# Patient Record
Sex: Male | Born: 1970 | Race: White | Hispanic: No | Marital: Married | Smoking: Never smoker
Health system: Southern US, Community
[De-identification: ages and names within clinical notes are randomized; demographics above are authoritative.]

## PROBLEM LIST (undated history)

## (undated) DIAGNOSIS — J309 Allergic rhinitis, unspecified: Secondary | ICD-10-CM

## (undated) DIAGNOSIS — G4733 Obstructive sleep apnea (adult) (pediatric): Secondary | ICD-10-CM

## (undated) DIAGNOSIS — I471 Supraventricular tachycardia: Secondary | ICD-10-CM

## (undated) DIAGNOSIS — I4719 Other supraventricular tachycardia: Secondary | ICD-10-CM

## (undated) DIAGNOSIS — K219 Gastro-esophageal reflux disease without esophagitis: Secondary | ICD-10-CM

## (undated) DIAGNOSIS — N4340 Spermatocele of epididymis, unspecified: Secondary | ICD-10-CM

## (undated) DIAGNOSIS — E785 Hyperlipidemia, unspecified: Secondary | ICD-10-CM

## (undated) HISTORY — DX: Gastro-esophageal reflux disease without esophagitis: K21.9

## (undated) HISTORY — DX: Other supraventricular tachycardia: I47.19

## (undated) HISTORY — DX: Allergic rhinitis, unspecified: J30.9

## (undated) HISTORY — DX: Supraventricular tachycardia: I47.1

## (undated) HISTORY — DX: Spermatocele of epididymis, unspecified: N43.40

## (undated) HISTORY — DX: Obstructive sleep apnea (adult) (pediatric): G47.33

## (undated) HISTORY — DX: Hyperlipidemia, unspecified: E78.5

---

## 2005-06-08 ENCOUNTER — Ambulatory Visit: Payer: Self-pay | Admitting: Pulmonary Disease

## 2005-07-12 ENCOUNTER — Ambulatory Visit: Payer: Self-pay | Admitting: Pulmonary Disease

## 2005-08-02 HISTORY — PX: OTHER SURGICAL HISTORY: SHX169

## 2006-07-14 ENCOUNTER — Ambulatory Visit: Payer: Self-pay | Admitting: Pulmonary Disease

## 2006-09-01 ENCOUNTER — Ambulatory Visit: Payer: Self-pay | Admitting: Pulmonary Disease

## 2007-03-09 ENCOUNTER — Ambulatory Visit: Payer: Self-pay | Admitting: Pulmonary Disease

## 2007-05-13 DIAGNOSIS — M67919 Unspecified disorder of synovium and tendon, unspecified shoulder: Secondary | ICD-10-CM | POA: Insufficient documentation

## 2007-05-13 DIAGNOSIS — M719 Bursopathy, unspecified: Secondary | ICD-10-CM

## 2007-05-13 DIAGNOSIS — J309 Allergic rhinitis, unspecified: Secondary | ICD-10-CM | POA: Insufficient documentation

## 2007-07-17 ENCOUNTER — Ambulatory Visit: Payer: Self-pay | Admitting: Pulmonary Disease

## 2007-07-23 DIAGNOSIS — N4340 Spermatocele of epididymis, unspecified: Secondary | ICD-10-CM | POA: Insufficient documentation

## 2007-07-23 DIAGNOSIS — S42009A Fracture of unspecified part of unspecified clavicle, initial encounter for closed fracture: Secondary | ICD-10-CM | POA: Insufficient documentation

## 2007-07-23 DIAGNOSIS — J329 Chronic sinusitis, unspecified: Secondary | ICD-10-CM | POA: Insufficient documentation

## 2007-07-23 DIAGNOSIS — K219 Gastro-esophageal reflux disease without esophagitis: Secondary | ICD-10-CM

## 2007-07-23 LAB — CONVERTED CEMR LAB
ALT: 29 units/L (ref 0–53)
AST: 24 units/L (ref 0–37)
Albumin: 4.6 g/dL (ref 3.5–5.2)
Alkaline Phosphatase: 42 units/L (ref 39–117)
Basophils Absolute: 0 10*3/uL (ref 0.0–0.1)
Basophils Relative: 0.3 % (ref 0.0–1.0)
Bilirubin Urine: NEGATIVE
CO2: 33 meq/L — ABNORMAL HIGH (ref 19–32)
Calcium: 9.7 mg/dL (ref 8.4–10.5)
Chloride: 102 meq/L (ref 96–112)
Cholesterol: 197 mg/dL (ref 0–200)
Creatinine, Ser: 0.9 mg/dL (ref 0.4–1.5)
Eosinophils Absolute: 0 10*3/uL (ref 0.0–0.6)
Eosinophils Relative: 0.8 % (ref 0.0–5.0)
GFR calc Af Amer: 121 mL/min
GFR calc non Af Amer: 100 mL/min
Hemoglobin: 16.2 g/dL (ref 13.0–17.0)
MCV: 89.8 fL (ref 78.0–100.0)
Monocytes Absolute: 0.4 10*3/uL (ref 0.2–0.7)
Neutrophils Relative %: 71.4 % (ref 43.0–77.0)
Platelets: 170 10*3/uL (ref 150–400)
Potassium: 4.1 meq/L (ref 3.5–5.1)
RDW: 11.5 % (ref 11.5–14.6)
Sodium: 142 meq/L (ref 135–145)
Total Protein, Urine: NEGATIVE mg/dL
Urobilinogen, UA: 0.2 (ref 0.0–1.0)
VLDL: 25 mg/dL (ref 0–40)

## 2007-09-21 ENCOUNTER — Ambulatory Visit: Payer: Self-pay | Admitting: Pulmonary Disease

## 2007-09-21 ENCOUNTER — Encounter: Payer: Self-pay | Admitting: Adult Health

## 2007-10-18 ENCOUNTER — Ambulatory Visit: Payer: Self-pay | Admitting: Pulmonary Disease

## 2008-02-13 ENCOUNTER — Ambulatory Visit: Payer: Self-pay | Admitting: Pulmonary Disease

## 2008-03-11 ENCOUNTER — Encounter: Payer: Self-pay | Admitting: Pulmonary Disease

## 2008-03-14 ENCOUNTER — Ambulatory Visit (HOSPITAL_BASED_OUTPATIENT_CLINIC_OR_DEPARTMENT_OTHER): Admission: RE | Admit: 2008-03-14 | Discharge: 2008-03-14 | Payer: Self-pay | Admitting: Pulmonary Disease

## 2008-03-14 ENCOUNTER — Encounter: Payer: Self-pay | Admitting: Pulmonary Disease

## 2008-03-19 ENCOUNTER — Encounter: Payer: Self-pay | Admitting: Pulmonary Disease

## 2008-03-22 ENCOUNTER — Ambulatory Visit: Payer: Self-pay | Admitting: Pulmonary Disease

## 2008-04-16 ENCOUNTER — Encounter: Payer: Self-pay | Admitting: Pulmonary Disease

## 2008-04-22 ENCOUNTER — Ambulatory Visit: Payer: Self-pay | Admitting: Pulmonary Disease

## 2008-04-22 DIAGNOSIS — G4733 Obstructive sleep apnea (adult) (pediatric): Secondary | ICD-10-CM | POA: Insufficient documentation

## 2008-06-25 ENCOUNTER — Encounter: Payer: Self-pay | Admitting: Pulmonary Disease

## 2008-07-02 ENCOUNTER — Encounter: Payer: Self-pay | Admitting: Pulmonary Disease

## 2008-07-18 ENCOUNTER — Ambulatory Visit: Payer: Self-pay | Admitting: Pulmonary Disease

## 2008-10-21 ENCOUNTER — Ambulatory Visit: Payer: Self-pay | Admitting: Pulmonary Disease

## 2008-11-07 ENCOUNTER — Telehealth (INDEPENDENT_AMBULATORY_CARE_PROVIDER_SITE_OTHER): Payer: Self-pay | Admitting: *Deleted

## 2008-12-24 ENCOUNTER — Encounter: Payer: Self-pay | Admitting: Pulmonary Disease

## 2009-01-14 ENCOUNTER — Encounter: Payer: Self-pay | Admitting: Pulmonary Disease

## 2009-07-15 ENCOUNTER — Encounter: Payer: Self-pay | Admitting: Pulmonary Disease

## 2010-01-05 ENCOUNTER — Ambulatory Visit: Payer: Self-pay | Admitting: Pulmonary Disease

## 2010-01-06 DIAGNOSIS — J069 Acute upper respiratory infection, unspecified: Secondary | ICD-10-CM | POA: Insufficient documentation

## 2010-01-07 ENCOUNTER — Telehealth: Payer: Self-pay | Admitting: Adult Health

## 2010-01-13 ENCOUNTER — Encounter: Payer: Self-pay | Admitting: Pulmonary Disease

## 2010-02-24 ENCOUNTER — Encounter: Payer: Self-pay | Admitting: Pulmonary Disease

## 2010-04-01 ENCOUNTER — Ambulatory Visit: Payer: Self-pay | Admitting: Pulmonary Disease

## 2010-04-05 DIAGNOSIS — E785 Hyperlipidemia, unspecified: Secondary | ICD-10-CM | POA: Insufficient documentation

## 2010-04-05 LAB — CONVERTED CEMR LAB
ALT: 19 units/L (ref 0–53)
AST: 19 units/L (ref 0–37)
Alkaline Phosphatase: 52 units/L (ref 39–117)
BUN: 13 mg/dL (ref 6–23)
Basophils Absolute: 0 10*3/uL (ref 0.0–0.1)
Bilirubin, Direct: 0.2 mg/dL (ref 0.0–0.3)
Calcium: 9.7 mg/dL (ref 8.4–10.5)
Creatinine, Ser: 0.8 mg/dL (ref 0.4–1.5)
GFR calc non Af Amer: 116.64 mL/min (ref >60)
Glucose, Bld: 87 mg/dL (ref 70–99)
LDL Cholesterol: 129 mg/dL — ABNORMAL HIGH (ref 0–99)
Lymphocytes Relative: 23.9 % (ref 12.0–46.0)
MCHC: 35.1 g/dL (ref 30.0–36.0)
Monocytes Absolute: 0.5 10*3/uL (ref 0.1–1.0)
Monocytes Relative: 7.9 % (ref 3.0–12.0)
Neutro Abs: 4.2 10*3/uL (ref 1.4–7.7)
Neutrophils Relative %: 66.1 % (ref 43.0–77.0)
Platelets: 154 10*3/uL (ref 150.0–400.0)
RBC: 5.35 M/uL (ref 4.22–5.81)
RDW: 12.9 % (ref 11.5–14.6)
Specific Gravity, Urine: 1.02 (ref 1.000–1.030)
Total CHOL/HDL Ratio: 5
Total Protein, Urine: NEGATIVE mg/dL
Triglycerides: 155 mg/dL — ABNORMAL HIGH (ref 0.0–149.0)
Urine Glucose: NEGATIVE mg/dL
WBC: 6.4 10*3/uL (ref 4.5–10.5)

## 2010-05-06 ENCOUNTER — Telehealth (INDEPENDENT_AMBULATORY_CARE_PROVIDER_SITE_OTHER): Payer: Self-pay | Admitting: *Deleted

## 2010-06-21 ENCOUNTER — Encounter: Payer: Self-pay | Admitting: Pulmonary Disease

## 2010-07-14 ENCOUNTER — Encounter: Payer: Self-pay | Admitting: Pulmonary Disease

## 2010-08-05 ENCOUNTER — Encounter: Payer: Self-pay | Admitting: Pulmonary Disease

## 2010-08-30 LAB — CONVERTED CEMR LAB
AST: 24 units/L (ref 0–37)
Albumin: 4.5 g/dL (ref 3.5–5.2)
Alkaline Phosphatase: 58 units/L (ref 39–117)
Bacteria, UA: NEGATIVE
Basophils Relative: 0.3 % (ref 0.0–3.0)
Calcium: 9.6 mg/dL (ref 8.4–10.5)
Eosinophils Absolute: 0.1 10*3/uL (ref 0.0–0.7)
GFR calc non Af Amer: 100 mL/min
HDL: 38.2 mg/dL — ABNORMAL LOW (ref >39.0)
Hemoglobin: 17.1 g/dL — ABNORMAL HIGH (ref 13.0–17.0)
Lymphocytes Relative: 25.4 % (ref 12.0–46.0)
MCHC: 34.5 g/dL (ref 30.0–36.0)
Monocytes Absolute: 0.5 10*3/uL (ref 0.1–1.0)
Monocytes Relative: 8.2 % (ref 3.0–12.0)
Neutro Abs: 3.7 10*3/uL (ref 1.4–7.7)
Nitrite: NEGATIVE
Potassium: 4 meq/L (ref 3.5–5.1)
RBC / HPF: NONE SEEN
RDW: 11.5 % (ref 11.5–14.6)
Specific Gravity, Urine: 1.015 (ref 1.000–1.03)
Total Protein, Urine: NEGATIVE mg/dL
Urine Glucose: NEGATIVE mg/dL
Urobilinogen, UA: 0.2 (ref 0.0–1.0)
pH: 7 (ref 5.0–8.0)

## 2010-09-02 NOTE — Progress Notes (Signed)
  Phone Note Other Incoming   Request: Send information Summary of Call: Request for records received from [facility]. Request forwarded to [facility]. Kriste Basque )

## 2010-09-02 NOTE — Letter (Signed)
[facility]   Imported By: Lennie Odor 07/06/2010 16:10:31  _____________________________________________________________________  External Attachment:    Type:   Image     Comment:   External Document

## 2010-09-02 NOTE — Assessment & Plan Note (Signed)
Summary: Acute NP office visit    Copy to:  Alroy Dust Primary Provider/Referring Provider:  Kriste Basque  CC:  sinus pressure/congestion, cough occ producing dark brown/yellow mucus, PND, body aches, weakness/fatigue, and occ nausea x2weeks. Marland Kitchen  History of Present Illness: 40  y/o WM here for a follow up visit & CPX... he & wife are expecting their first child (prob male by sonar) in April...   ~  seen 7/09 w/ bout of sinusitis w/ some persistant upper airway symptoms... he's had sevearal weeks of feeling poorly, sinus congestion/ draining/ stuffy w/ pressure in the head... assoc dizzy, fatigued, and coughing... he went to a Med 1st and diagnosed w/ sinusitis after an XRay was done- given Augmentin 875mg  Bid for 10d + Flonase... slight better but symptoms persist... we discuused Rx w/ Medrol to reduce the upper airway inflamm...   ~  July 18, 2008:  he reports doing well overall without new complaints or cioncerns...  January 05, 2010--Presents for an acute office visit. Complains of sinus pressure/congestion, cough occ producing dark brown/yellow mucus, PND, body aches, weakness/fatigue, occ nausea x2weeks.  OTC not working. He is living in [place]. Has CPX set up for later ths summer. Wants to keep PCP as Kriste Basque. Denies chest pain, dyspnea, orthopnea, hemoptysis, fever, n/v/d, edema, headache./   Medications Prior to Update: 1)  Ocean Nasal Spray 0.65 %  Soln (Saline) .... As Needed 2)  Metoprolol Succinate 25 Mg Xr24h-Tab (Metoprolol Succinate) .... Take 1 Tablet By Mouth Once A Day 3)  Fish Oil 1000 Mg Caps (Omega-3 Fatty Acids) .... Take 1 Tablet By Mouth Once A Day 4)  Prilosec Otc 20 Mg  Tbec (Omeprazole Magnesium) .... Take 1 Tablet By Mouth Every Morning 30 Min Before Meal 5)  Pepcid 20 Mg  Tabs (Famotidine) .... Take 1 Tab By Mouth At Bedtime As Needed 6)  Multivitamins  Tabs (Multiple Vitamin) .... Take 1 Tablet By Mouth Once A Day 7)  Vitamin B-12 1000 Mcg Tabs (Cyanocobalamin) ....  Take 1 Tablet By Mouth Once A Day  Current Medications (verified): 1)  Ocean Nasal Spray 0.65 %  Soln (Saline) .... As Needed 2)  Metoprolol Succinate 25 Mg Xr24h-Tab (Metoprolol Succinate) .... Take 1 Tablet By Mouth Once A Day 3)  Fish Oil 1000 Mg Caps (Omega-3 Fatty Acids) .... Take 1 Tablet By Mouth Once A Day 4)  Prilosec Otc 20 Mg  Tbec (Omeprazole Magnesium) .... Take 1 Tablet By Mouth Every Morning 30 Min Before Meal 5)  Pepcid 20 Mg  Tabs (Famotidine) .... Take 1 Tab By Mouth At Bedtime As Needed 6)  Multivitamins  Tabs (Multiple Vitamin) .... Take 1 Tablet By Mouth Once A Day 7)  Vitamin B-12 1000 Mcg Tabs (Cyanocobalamin) .... Take 1 Tablet By Mouth Once A Day  Allergies (verified): 1)  ! Codeine 2)  ! Cephalexin 3)  ! * Shellfish  Past History:  Past Surgical History: Last updated: 07/18/2008 S/P left clavicle surgery after fracture in 2007  Family History: Last updated: 04/22/2008 heart disease: father, maternal aunt, paternal grandmother cancer: maternal grandmother (skin)  Social History: Last updated: 04/22/2008 Works for [employer]  pt is married. Patient never smoked.   Risk Factors: Smoking Status: never (07/18/2008)  Past Medical History:  ALLERGIC RHINITIS (ICD-477.9) - uses Ocean spray and Flonase...  Hx of SINUSITIS (ICD-473.9) - resolved w/ Rx in Jul09...  OBSTRUCTIVE SLEEP APNEA (ICD-327.23)  ~  wife c/o his snoring... he has done this  for years w/ college buddies also noting his snoring w/ apnea events... wife is a sound sleeper & hasn't noted apneas, restlessness, etc... he does note tiredness in the AM's however & we did a sleep study 8/09 w/ AHI= 11 & RDI= 21 w/ desat to 86%... given trial CPAP 8 & saw DrClance 9/09 w/ referral to [facility] dental clinic for oral appliance- he will get this soon and give it a try...  Hx of PREMATURE VENTRICULAR CONTRACTIONS (ICD-427.69) - he had thorough eval by DrKennedy in [facility] w/ event monitor showing atrial tachy and Stress Echo that was normal 8/09... Rx w/ METOPROLOL ER 25mg /d...     Review of Systems      See HPI  Vital Signs:  Patient profile:   40 year old male Height:      75 inches Weight:      183 pounds BMI:     22.96 O2 Sat:      98 % on Room air Temp:     98.1 degrees F oral Pulse rate:   79 / minute BP sitting:   132 / 94  (left arm) Cuff size:   regular  Vitals Entered By: Boone Master CNA/MA (January 05, 2010 3:03 PM)  O2 Flow:  Room air CC: sinus pressure/congestion, cough occ producing dark brown/yellow mucus, PND, body aches, weakness/fatigue, occ nausea x2weeks.  Is Patient Diabetic? No Comments Medications reviewed with patient Daytime contact number verified with patient. Boone Master CNA/MA  January 05, 2010 3:05 PM    Physical Exam  Additional Exam:  GEN: A/Ox3; pleasant , NAD HEENT:  [place]/AT, , EACs-clear, TMs-wnl, NOSE-clear, THROAT-clear NECK:  Supple w/ fair ROM; no JVD; normal carotid impulses w/o bruits; no thyromegaly or nodules palpated; no lymphadenopathy. RESP  Clear to P & A; w/o, wheezes/ rales/ or rhonchi. CARD:  RRR, no m/r/g   GI:   Soft & nt; nml bowel sounds; no organomegaly or masses detected. [place]: Warm bil,  no calf tenderness edema, clubbing, pulses intact     Impression & Recommendations:  Problem # 1:  URI (ICD-465.9)  Zithromax 500mg  once daily for 3 days Mucinex DM two times a day as needed cough/congesiton  Zyrtec 10mg  at bedtime for 5 days for drainage.  Increase fluids. , rest. May use tylenol as needed fever  Saline nasal rinse as needed  Please contact office for sooner follow up if symptoms do not improve or worsen   Orders: Est. Patient Level III (14782)  Medications Added to Medication List This Visit: 1)  Zithromax Tri-pak 500 Mg Tabs (Azithromycin) .... Take as directed  Complete Medication List: 1)  Ocean Nasal Spray 0.65 % Soln (Saline) .... As  needed 2)  Metoprolol Succinate 25 Mg Xr24h-tab (Metoprolol succinate) .... Take 1 tablet by mouth once a day 3)  Fish Oil 1000 Mg Caps (Omega-3 fatty acids) .... Take 1 tablet by mouth once a day 4)  Prilosec Otc 20 Mg Tbec (Omeprazole magnesium) .... Take 1 tablet by mouth every morning 30 min before meal 5)  Pepcid 20 Mg Tabs (Famotidine) .... Take 1 tab by mouth at bedtime as needed 6)  Multivitamins Tabs (Multiple vitamin) .... Take 1 tablet by mouth once a day 7)  Vitamin B-12 1000 Mcg Tabs (Cyanocobalamin) .... Take 1 tablet by mouth once a day 8)  Zithromax Tri-pak 500 Mg Tabs (Azithromycin) .... Take as directed  Patient Instructions: 1)  follow up with Dr. Kriste Basque for  physical in 3 months for fasting labs. as scheduled  2)  Zithromax 500mg  once daily for 3 days 3)  Mucinex DM two times a day as needed cough/congesiton  4)  Zyrtec 10mg  at bedtime for 5 days for drainage.  5)  Increase fluids. , rest. May use tylenol as needed fever  6)  Saline nasal rinse as needed  7)  Please contact office for sooner follow up if symptoms do not improve or worsen  Prescriptions: ZITHROMAX TRI-PAK 500 MG TABS (AZITHROMYCIN) take as directed  #1 x 0   Entered and Authorized by:   Rubye Oaks NP   Signed by:   Rubye Oaks NP on 01/05/2010   Method used:   Electronically to        [facility] [address] (retail)       [address]       [place], [place]  [address]       Ph: [phone]       Fax: [phone]   RxID:   [id number]   Prevention & Chronic Care Immunizations   Influenza vaccine: Historical  (05/02/2009)    Tetanus booster: Not documented    Pneumococcal vaccine: Not documented  Other Screening   Smoking status: never  (07/18/2008)  Lipids   Total Cholesterol: 184  (07/18/2008)   LDL: 132  (07/18/2008)   LDL Direct: Not documented   HDL: 38.2  (07/18/2008)   Triglycerides: 69  (07/18/2008)    Immunization History:  Influenza Immunization  History:    Influenza:  historical (05/02/2009)

## 2010-09-02 NOTE — Progress Notes (Signed)
Summary: fever  Phone Note Call from Patient   Caller: Patient Call For: parrett Summary of Call: pt have fever was seen by  tammy this week for sinus problem .still have fever taking advil only able to get it down to 100 [facility] [phone] Initial call taken by: Rickard Patience,  January 07, 2010 8:25 AM  Follow-up for Phone Call        Pt states at time of OV he had not been having any fever, but yesterday he began to have a fever of 101.4. he is taking advil every 4 hours and staes he can't get fever below 100. He has one day left on abx course. Pt just wanted to let TP know since this was a change, and if she had any further recs. Please advise. Carron Curie CMA  January 07, 2010 8:55 AM   Additional Follow-up for Phone Call Additional follow up Details #1::        zpack last in system in for 10 days would alternate tylenol and motrin increase fluids.  Please contact office for sooner follow up if symptoms do not improve or worsen  Additional Follow-up by: Rubye Oaks NP,  January 07, 2010 9:47 AM    Additional Follow-up for Phone Call Additional follow up Details #2::    Pt advised. Carron Curie CMA  January 07, 2010 9:57 AM

## 2010-09-02 NOTE — Letter (Signed)
[facility]   Imported By: Lester Crookston 03/04/2010 07:49:33  _____________________________________________________________________  External Attachment:    Type:   Image     Comment:   External Document

## 2010-09-02 NOTE — Letter (Signed)
Summary: [facility]  [facility]   Imported By: Lester Rothschild 08/05/2009 10:53:23  _____________________________________________________________________  External Attachment:    Type:   Image     Comment:   External Document

## 2010-09-02 NOTE — Letter (Signed)
Summary: [facility]   Imported By: Lennie Odor 03/03/2010 14:28:52  _____________________________________________________________________  External Attachment:    Type:   Image     Comment:   External Document

## 2010-09-02 NOTE — Assessment & Plan Note (Signed)
Summary: physical ///kp   Primary Care Knowledge [place]:  Kriste Basque  CC:  20 month ROV & CPX....  History of Present Illness: 40 y/o WM here for a follow up visit & CPX...    ~  seen 7/09 w/ bout of sinusitis w/ some persistant upper airway symptoms... he's had sevearal weeks of feeling poorly, sinus congestion/ draining/ stuffy w/ pressure in the head... assoc dizzy, fatigued, and coughing... he went to a Med 1st and diagnosed w/ sinusitis after an XRay was done- given Augmentin 875mg  Bid for 10d + Flonase... slight better but symptoms persist... we discuused Rx w/ Medrol to reduce the upper airway inflamm...   ~  April 01, 2010:  Baby daughter [name] born 02/28/09... he had f/u Cards in [place]- atyp CP, hx atrial tachy, occas palpit, doing satis on [id number]...     Current Problem List:  ALLERGIC RHINITIS (ICD-477.9) - uses Ocean spray and Flonase...  Hx of SINUSITIS (ICD-473.9) - resolved w/ Rx in Jul09...  OBSTRUCTIVE SLEEP APNEA (ICD-327.23) - initially wife c/o his snoring... he has done this for years w/ college buddies also noting his snoring w/ apnea events... wife is a sound sleeper & hasn't noted apneas, restlessness, etc... he did note tiredness in the AM's & we did a sleep study 8/09 w/ AHI= 11 & RDI= 21 w/ desat to 86%... given trial CPAP 8 & saw DrClance 9/09 w/ referral to [facility] dental clinic for oral appliance- he reports this caused some jaw problems therefore stopped using it & returned to CPAP but stopped this as well over time... now he states he is resting well- not snoring, no daytime sleep pressure & doing OK.  Hx of PREMATURE VENTRICULAR CONTRACTIONS (ICD-427.69) - he had thorough eval by DrKennedy in [facility] w/ event monitor showing atrial tachy and Stress Echo that was normal 8/09... Rx w/ METOPROLOL ER 25mg /d... denies HA, fatigue, visual changes, CP, palipit, dizziness, syncope, dyspnea, edema, etc...   HYPERLIPIDEMIA, BORDERLINE  (ICD-272.4) - on diet Rx alone...  ~  FLP 12/08 showed TChol 197, TG 124, HDL 39, LDL 133  ~  FLP 12/09 showed TChol 184, TG 69, HDL 38, LDL 132  ~  FLP 8/11 showed TChol 199, TG 155, HDL 39, LDL 129  GERD (ICD-530.81) - on PRILOSEC 20mg /d and PEPCID at bedtime...  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 f/u Prn.  Hx of FRACTURE, CLAVICLE, RIGHT (ICD-810.00) w/ surgery... BURSITIS, RIGHT SHOULDER (ICD-726.10) - he is followed by DrCalloway in [facility] for Ortho & had physical therapy...  Health Maintenance:  he takes FISH OIL & MVI... he gets the Flu vaccine yearly & last Tetanus shot was ??   Preventive Screening-Counseling & Management  Alcohol-Tobacco     Smoking Status: never  Allergies: 1)  ! Codeine 2)  ! Cephalexin 3)  ! * Shellfish  Comments:  Nurse/Medical Assistant: The patient's medications and allergies were reviewed with the patient and were updated in the Medication and Allergy Lists.  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 heart disease: father, maternal aunt, paternal grandmother cancer: maternal grandmother (  skin)  Social History: Reviewed history from 04/22/2008 and no changes required. Works for [employer]  pt is married. Patient never smoked.   Review of Systems       The patient complains of nasal congestion and palpitations.  The patient denies fever, chills, sweats,  anorexia, fatigue, weakness, malaise, weight loss, sleep disorder, blurring, diplopia, eye irritation, eye discharge, vision loss, eye pain, photophobia, earache, ear discharge, tinnitus, decreased hearing, nosebleeds, sore throat, hoarseness, chest pain, syncope, dyspnea on exertion, orthopnea, PND, peripheral edema, cough, dyspnea at rest, excessive sputum, hemoptysis, wheezing, pleurisy, nausea, vomiting, diarrhea, constipation, change in bowel habits, abdominal pain, melena, hematochezia, jaundice, gas/bloating, indigestion/heartburn, dysphagia, odynophagia, dysuria, hematuria, urinary frequency, urinary hesitancy, nocturia, incontinence, back pain, joint pain, joint swelling, muscle cramps, muscle weakness, stiffness, arthritis, sciatica, restless legs, leg pain at night, leg pain with exertion, rash, itching, dryness, suspicious lesions, paralysis, paresthesias, seizures, tremors, vertigo, transient blindness, frequent falls, frequent headaches, difficulty walking, depression, anxiety, memory loss, confusion, cold intolerance, heat intolerance, polydipsia, polyphagia, polyuria, unusual weight change, abnormal bruising, bleeding, enlarged lymph nodes, urticaria, allergic rash, hay fever, and recurrent infections.    Vital Signs:  Patient profile:   40 year old male Height:      75 inches Weight:      182 pounds BMI:     22.83 O2 Sat:      97 % on Room air Temp:     97.1 degrees F oral Pulse rate:   67 / minute BP sitting:   120 / 82  (left arm) Cuff size:   regular  Vitals Entered By: Randell Loop CMA (April 01, 2010 10:03 AM)  O2 Sat at Rest %:  97 O2 Flow:  Room air CC: 20 month ROV & CPX... Is Patient Diabetic? No Pain Assessment Patient in pain? no      Comments meds updated today   Physical Exam  Additional Exam:  WD, WN, 40 y/o WM in NAD... GENERAL:  Alert & oriented; pleasant & cooperative... HEENT:  [place]/AT, EOM-wnl, PERRLA, Fundi-benign, EACs-clear, TMs-wnl, NOSE-clear,  THROAT-clear & wnl. NECK:  Supple w/ full ROM; no JVD; normal carotid impulses w/o bruits; no thyromegaly or nodules palpated; no lymphadenopathy. Scar over right clacicle from prev fx & surg... CHEST:  Clear to P & A; without wheezes/ rales/ or rhonchi. HEART:  Regular Rhythm; without murmurs/ rubs/ or gallops. ABDOMEN:  Soft & nontender; normal bowel sounds; no organomegaly or masses detected. RECTAL:  Neg - prostate 2+ & nontender w/o nodules; stool hematest neg; +left spermatocele... EXT: without deformities or arthritic changes; no varicose veins/ venous insuffic/ or edema. NEURO:  CN's intact; motor testing normal; sensory testing normal; gait normal & balance OK. DERM:  No lesions noted; no rash etc...    CXR  Procedure date:  04/01/2010  Findings:      CHEST - 2 VIEW Comparison: Chest x-ray 07/18/2008   Findings: The cardiac silhouette, mediastinal and hilar contours are within normal limits.  The lungs are clear.  The bony thorax is intact.  Stable right clavicular hardware.   IMPRESSION: No acute cardiopulmonary findings.  No change since prior study.   Read By:  Cyndie Chime,  M.D.   MISC. Report  Procedure date:  04/01/2010  Findings:      BMP (METABOL)   Sodium                    141 mEq/L  135-145   Potassium                 4.0 mEq/L                   3.5-5.1   Chloride                  102 mEq/L                   96-112   Carbon Dioxide            30 mEq/L                    19-32   Glucose                   87 mg/dL                    56-38   BUN                       13 mg/dL                    7-56   Creatinine                0.8 mg/dL                   4.3-3.2   Calcium                   9.7 mg/dL                   9.5-18.8   GFR                       116.64 mL/min               >60  Hepatic/Liver Function Panel (HEPATIC)   Total Bilirubin      [H]  1.7 mg/dL                   4.1-6.6   Direct Bilirubin          0.2 mg/dL                    0.6-3.0   Alkaline Phosphatase      52 U/L                      39-117   AST                       19 U/L                      0-37   ALT                       19 U/L                      0-53   Total Protein             7.4 g/dL                    1.6-0.1   Albumin                   4.8 g/dL  3.5-5.2  CBC Platelet w/Diff (CBCD)   White Cell Count          6.4 K/uL                    4.5-10.5   Red Cell Count            5.35 Mil/uL                 4.22-5.81   Hemoglobin                16.7 g/dL                   16.1-09.6   Hematocrit                47.7 %                      39.0-52.0   MCV                       89.0 fl                     78.0-100.0   Platelet Count            154.0 K/uL                  150.0-400.0   Neutrophil %              66.1 %                      43.0-77.0   Lymphocyte %              23.9 %                      12.0-46.0   Monocyte %                7.9 %                       3.0-12.0   Eosinophils%              1.7 %                       0.0-5.0   Basophils %               0.4 %                       0.0-3.0  Comments:      Lipid Panel (LIPID)   Cholesterol               199 mg/dL                   0-454   Triglycerides        [H]  155.0 mg/dL                 0.9-811.9   HDL                       14.78 mg/dL                 >29.56   LDL Cholesterol      [H]  213 mg/dL  0-99   TSH (TSH)   FastTSH                   1.35 uIU/mL                 0.35-5.50  UDip w/Micro (URINE)   Color                     LT. YELLOW   Clarity                   CLEAR                       Clear   Specific Gravity          1.020                       1.000 - 1.030   Urine Ph                  7.0                         5.0-8.0   Protein                   NEGATIVE                    Negative   Urine Glucose             NEGATIVE                    Negative   Ketones                   NEGATIVE                    Negative   Urine  Bilirubin           NEGATIVE                    Negative   Blood                     NEGATIVE                    Negative   Urobilinogen              0.2                         0.0 - 1.0   Leukocyte Esterace        NEGATIVE                    Negative   Nitrite                   NEGATIVE                    Negative   Urine Mucus               Presence of                 None   Impression & Recommendations:  Problem # 1:  PHYSICAL EXAMINATION (ICD-V70.0)  Orders: T-2 View CXR (71020TC) TLB-BMP (Basic Metabolic Panel-BMET) (80048-METABOL) TLB-Hepatic/Liver Function Pnl (80076-HEPATIC) TLB-CBC Platelet - w/Differential (85025-CBCD) TLB-Lipid Panel (80061-LIPID) TLB-TSH (Thyroid Stimulating  Hormone) (84443-TSH) TLB-Udip w/ Micro (81001-URINE)  Problem # 2:  OBSTRUCTIVE SLEEP APNEA (ICD-327.23) He states doing well off all therapy- no hypersomnolence etc...  Problem # 3:  Hx of PREMATURE VENTRICULAR CONTRACTIONS (ICD-427.69) He has been eval at Cardiology in > [place]x atriasl tachy & atyp CP on TOPROL XL 25/d & doing satis... His updated medication list for this problem includes:    Metoprolol Succinate 25 Mg Xr24h-tab (Metoprolol succinate) .Marland Kitchen... Take 1 tablet by mouth once a day  Problem # 4:  HYPERLIPIDEMIA, BORDERLINE (ICD-272.4) On diet alone...  Problem # 5:  GERD (ICD-530.81) Stable w/ OTC med Rx as needed... His updated medication list for this problem includes:    Prilosec Otc 20 Mg Tbec (Omeprazole magnesium) .Marland Kitchen... Take 1 tablet by mouth every morning 30 min before meal    Pepcid 20 Mg Tabs (Famotidine) .Marland Kitchen... 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  Problem # 7:  OTHER MEDICAL ISSUEWS AS NOTED>>>  Complete Medication List: 1)  Ocean Nasal Spray 0.65 % Soln (Saline) .... As needed 2)  Metoprolol Succinate 25 Mg Xr24h-tab (Metoprolol succinate) .... Take 1 tablet by mouth once a  day 3)  Fish Oil 1000 Mg Caps (Omega-3 fatty acids) .... Take 1 tablet by mouth once a day 4)  Prilosec Otc 20 Mg Tbec (Omeprazole magnesium) .... Take 1 tablet by mouth every morning 30 min before meal 5)  Pepcid 20 Mg Tabs (Famotidine) .... Take 1 tab by mouth at bedtime as needed 6)  Multivitamins Tabs (Multiple vitamin) .... Take 1 tablet by mouth once a day 7)  Vitamin B-12 1000 Mcg Tabs (Cyanocobalamin) .... Take 1 tablet by mouth once a day  Patient Instructions: 1)  Today we updated your med list- see below.... 2)  Continue your current meds the same... 3)  Today we did your follow up CXR & Fasting blood work... please call the "phone tree" in a few days for your lab results.Marland KitchenMarland Kitchen 4)  Call for any problems.Marland KitchenMarland Kitchen 5)  Please schedule a follow-up appointment in 1 year.

## 2010-09-03 NOTE — Letter (Signed)
Summary: [facility]  [facility]   Imported By: Sherian Rein 08/14/2010 14:16:43  _____________________________________________________________________  External Attachment:    Type:   Image     Comment:   External Document

## 2010-09-03 NOTE — Letter (Signed)
[facility]   Imported By: Lester Whitfield 08/14/2010 08:56:13  _____________________________________________________________________  External Attachment:    Type:   Image     Comment:   External Document

## 2010-09-30 ENCOUNTER — Ambulatory Visit (INDEPENDENT_AMBULATORY_CARE_PROVIDER_SITE_OTHER): Payer: BC Managed Care – PPO | Admitting: Adult Health

## 2010-09-30 ENCOUNTER — Encounter: Payer: Self-pay | Admitting: Adult Health

## 2010-09-30 DIAGNOSIS — R079 Chest pain, unspecified: Secondary | ICD-10-CM

## 2010-09-30 DIAGNOSIS — I10 Essential (primary) hypertension: Secondary | ICD-10-CM

## 2010-10-01 DIAGNOSIS — R079 Chest pain, unspecified: Secondary | ICD-10-CM | POA: Insufficient documentation

## 2010-10-08 NOTE — Assessment & Plan Note (Signed)
Summary: Acute NP office visit - pulled muscle   Copy to:  Trevor Mccullough Primary Provider/Referring Provider:  Kriste Mccullough  CC:  pulled muscle in chest on right pectoral x29month - states has been "taking it easy" but denies any other therapies.  also c/o some occasional HTN w/ HA and brought BP log with him today.  History of Present Illness: 40  with known hx of PVC's, GERD, and borderline hyperlipidemia   September 30, 2010 --Presents for an acute office visit. Says he pulled muscle in chest on right pectoral x93month ago. Has small child and feels it pull when he picks them up. This past week bent down to pick up child and felt pain and soreness in right upper chest wall. Tender to touch,more pain with lifting, and turning.No exertional chest pain, palpitations, syncope or dizziness. Has not taken any meds for tx.  Also noticed b/p has been elevated over last several weeks. [facility]. His b/p log at home avg b/p 130-140 / 90. Denies  orthopnea, hemoptysis, fever, n/v/d, edema, headache, heartburn, belching , cough  or dypsnea, .   Medications Prior to Update: 1)  Ocean Nasal Spray 0.65 %  Soln (Saline) .... As Needed 2)  Metoprolol Succinate 25 Mg Xr24h-Tab (Metoprolol Succinate) .... Take 1 Tablet By Mouth Once A Day 3)  Fish Oil 1000 Mg Caps (Omega-3 Fatty Acids) .... Take 1 Tablet By Mouth Once A Day 4)  Prilosec Otc 20 Mg  Tbec (Omeprazole Magnesium) .... Take 1 Tablet By Mouth Every Morning 30 Min Before Meal 5)  Pepcid 20 Mg  Tabs (Famotidine) .... Take 1 Tab By Mouth At Bedtime As Needed 6)  Multivitamins  Tabs (Multiple Vitamin) .... Take 1 Tablet By Mouth Once A Day 7)  Vitamin B-12 1000 Mcg Tabs (Cyanocobalamin) .... Take 1 Tablet By Mouth Once A Day  Current Medications (verified): 1)  Ocean Nasal Spray 0.65 %  Soln (Saline) .... As Needed 2)  Metoprolol Succinate 25 Mg Xr24h-Tab (Metoprolol Succinate) .... Take 1 Tablet By Mouth Once A Day 3)  Fish Oil  1000 Mg Caps (Omega-3 Fatty Acids) .... Take 1 Tablet By Mouth Once A Day 4)  Prilosec Otc 20 Mg  Tbec (Omeprazole Magnesium) .... Take 1 Tablet By Mouth Every Morning 30 Min Before Meal 5)  Pepcid 20 Mg  Tabs (Famotidine) .... Take 1 Tab By Mouth At Bedtime As Needed 6)  Multivitamins  Tabs (Multiple Vitamin) .... Take 1 Tablet By Mouth Once A Day 7)  Vitamin B-12 1000 Mcg Tabs (Cyanocobalamin) .... Take 1 Tablet By Mouth Once A Day  Allergies (verified): 1)  ! Codeine 2)  ! Cephalexin 3)  ! 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 Patient never smoked.   Risk Factors: Smoking Status: never (04/01/2010)  Review of Systems      See HPI  Vital Signs:  Patient profile:   40 year old male Height:  75 inches Weight:      182.38 pounds BMI:     22.88 O2 Sat:      97 % on Room air Temp:     97.0 degrees F oral Pulse rate:   74 / minute BP sitting:   136 / 100  (left arm) Cuff size:   regular  Vitals Entered By: Boone Master CNA/MA (September 30, 2010 10:03 AM)  O2 Flow:  Room air CC: pulled muscle in chest on right pectoral x42month - states has been "taking it easy" but denies any other therapies.  also c/o some occasional HTN w/ HA and brought BP log with him today Is  Patient Diabetic? No Comments Medications reviewed with patient Daytime contact number verified with patient. Boone Master CNA/MA  September 30, 2010 10:04 AM    Physical Exam  Additional Exam:  GEN: A/Ox3; pleasant , NAD HEENT:  [place]/AT, , EACs-clear, TMs-wnl, NOSE-clear, THROAT-clear NECK:  Supple w/ fair ROM; no JVD; normal carotid impulses w/o bruits; no thyromegaly or nodules palpated; no lymphadenopathy. CHEST ;   Clear to P & A; w/o, wheezes/ rales/ or rhonchi., ,right chest wall nml, no deformity noted, tender to touch,no rash.  CARD:  RRR, no m/r/g   GI:   Soft & nt; nml bowel sounds; no organomegaly or masses detected. Musco: Warm bil,  no calf tenderness edema, clubbing, pulses intact turning /bending w/ reproducible pain in right Pectoral muscle. right shoulder ROM nml, nml grips , no radicular symptoms      Impression & Recommendations:  Problem # 1:  CHEST PAIN, RIGHT (ICD-786.50) Right sided chest wall pain appears to be musculoskeletal in nature Plan: Begin Ibuprofen 200mg  4 tabs three times a day w/ food for 5 days Alternate ice and heat to ribs as needed Please contact office for sooner follow up if symptoms do not improve or worsen     Problem # 2:  ESSENTIAL HYPERTENSION, BENIGN (ICD-401.1) Increase Metoprolol 50mg  once daily  low salt diet  Advance exercise as tolerated.   follow up Dr. Kriste Mccullough for physical in August this year.  Check blood pressure 3 xweek, call if >160/90. --keep log  His updated medication list for this problem includes:    Metoprolol Succinate 50 Mg Xr24h-tab (Metoprolol succinate) .Marland Kitchen... 1 by mouth once daily  Orders: Est. Patient Level IV (40981)  Medications Added to Medication List This Visit: 1)  Metoprolol Succinate 50 Mg Xr24h-tab (Metoprolol succinate) .Marland Kitchen.. 1 by mouth once daily  Patient Instructions: 1)  Increase Metoprolol 50mg  once daily  2)  low salt diet  3)  Advance exercise as tolerated.  4)  Begin Ibuprofen 200mg  4  tabs three times a day w/ food for 5 days 5)  Alternate ice and heat to ribs as needed 6)  Please contact office for sooner follow up if symptoms do not improve or worsen  7)  follow up Dr. Kriste Mccullough for physical in August this year.  8)  Check blood pressure 3 xweek, call if >160/90.  Prescriptions: METOPROLOL SUCCINATE 50 MG XR24H-TAB (METOPROLOL SUCCINATE) 1 by mouth once daily  #30 x 5   Entered and Authorized by:   Rubye Oaks NP   Signed by:   Rubye Oaks NP on 10/01/2010   Method used:   Electronically to        [facility] [address] (retail)       [address]       [place], [place]  [address]  Ph: [phone]       Fax: [phone]   RxID:   [id number]    Immunization History:  Influenza Immunization History:    Influenza:  historical (06/02/2010)

## 2010-10-19 ENCOUNTER — Telehealth: Payer: Self-pay | Admitting: Adult Health

## 2010-10-29 NOTE — Progress Notes (Signed)
Summary: bp med problems  Phone Note Call from Patient Call back at [facility] Phone [phone]   Caller: Patient Call For: nadel Summary of Call: Patient calling saying he saw Dr. Kriste Basque about 3 weeks ago and his bp was up.  Blood pressure med was increased, since then patient states he has had headaches, times of feeling dizzy, and fatigued.  Patient thinks these sypmtoms are from bp med increase.  Patient requesting appt/w Maxx Calaway or Kriste Basque. Initial call taken by: Lehman Prom,  October 19, 2010 8:27 AM  Follow-up for Phone Call        Spoke with pt and he is c/o since starting increased dose of metoprolol he has been having dizziness, calf cramping, fatigue, mood swings, and feeling faint. he staets his bp has been running between 115-128 over 70-78 and hr has been between 60-70bpm. Please advise.Carron Curie CMA  October 19, 2010 10:47 AM   Additional Follow-up for Phone Call Additional follow up Details #1::        try taking 1/2 by mouth two times a day  increase fluids  if not improving ov later week.  Additional Follow-up by: Rubye Oaks NP,  October 19, 2010 10:50 AM    Additional Follow-up for Phone Call Additional follow up Details #2::    pt advised of recs. Carron Curie CMA  October 19, 2010 11:15 AM

## 2010-12-15 NOTE — Procedures (Signed)
Trevor Mccullough, ODONOHUE NO.:  [id number]   MEDICAL RECORD NO.:  [id number]          PATIENT TYPE:  OUT   LOCATION:  SLEEP CENTER                 FACILITY:  [facility]   PHYSICIAN:  Barbaraann Share, MD,FCCPDATE OF BIRTH:  09-29-68   DATE OF STUDY:  03/14/2008                            NOCTURNAL POLYSOMNOGRAM   REFERRING PHYSICIAN:  Lonzo Cloud. Kriste Basque, MD   INDICATION FOR STUDY:  Hypersomnia with sleep apnea.   EPWORTH SLEEPINESS SCORE:  Seven.   SLEEP ARCHITECTURE:  The patient had a total sleep time of 336 minutes  with very little slow-wave sleep and REM.  Sleep onset latency was  mildly prolonged at 35 minutes, and REM onset was normal.  Sleep  efficiency was decreased moderately at 75%.   RESPIRATORY DATA:  The patient was found to have 8 obstructive apneas  and 55 obstructive hypopneas for an AHI of 11 events per hour.  He was  also noted to have 54 respiratory effort related arousals, giving him an  RDI of 21 events per hour.  The events occurred in all body position,  but were worse in the supine position.  Moderate snoring was noted  throughout.   OXYGEN DATA:  There was O2 desaturation as low as 86% with the patient's  obstructive events.   CARDIAC DATA:  No clinically significant arrhythmias were noted.   MOVEMENT/PARASOMNIA:  The patient was found to have no significant leg  jerks or abnormal behaviors.   IMPRESSION/RECOMMENDATION:  Mild to moderate obstructive sleep  apnea/hypopnea syndrome with an apnea/hypopnea index (AHI) 11 events per  hour, and a respiratory disturbance index (RDI) of 21 events per hour.  There was O2 desaturation as low as 86%.  Treatment for this degree of  sleep apnea can include weight loss alone if applicable, upper airway  surgery, oral appliance, and also continuous positive airway pressure  (CPAP).      Barbaraann Share, MD,FCCP  Diplomate, American Board of Sleep  Medicine  Electronically Signed     [facility]/MEDQ   D:  03/22/2008 18:02:27  T:  03/22/2008 19:24:54  Job:  [id number]

## 2011-02-01 ENCOUNTER — Encounter: Payer: Self-pay | Admitting: Pulmonary Disease

## 2011-03-11 ENCOUNTER — Telehealth: Payer: Self-pay | Admitting: Pulmonary Disease

## 2011-03-11 NOTE — Telephone Encounter (Signed)
According to centricity document from [facility] pt has tetanus shot on 12-24-2008. [id number] to advise the pt.Lucita Montoya Yancey Flemings, CMA

## 2011-03-11 NOTE — Telephone Encounter (Signed)
PT RETURNED CALL FROM TRIAGE. I ADVISED HIM RE: LAST TETNUS SHOT. HE SAID "THANKS" AND DIDN'T NEED ANYTHING FURTHER. Trevor Mccullough

## 2011-04-06 ENCOUNTER — Ambulatory Visit (INDEPENDENT_AMBULATORY_CARE_PROVIDER_SITE_OTHER)
Admission: RE | Admit: 2011-04-06 | Discharge: 2011-04-06 | Disposition: A | Discharge status: Home / Self Care | Payer: BC Managed Care – PPO | Source: Ambulatory Visit | Attending: Pulmonary Disease | Admitting: Pulmonary Disease

## 2011-04-06 ENCOUNTER — Ambulatory Visit (INDEPENDENT_AMBULATORY_CARE_PROVIDER_SITE_OTHER): Payer: BC Managed Care – PPO | Admitting: Pulmonary Disease

## 2011-04-06 ENCOUNTER — Other Ambulatory Visit (INDEPENDENT_AMBULATORY_CARE_PROVIDER_SITE_OTHER): Payer: BC Managed Care – PPO

## 2011-04-06 ENCOUNTER — Encounter: Payer: Self-pay | Admitting: Pulmonary Disease

## 2011-04-06 DIAGNOSIS — N4340 Spermatocele of epididymis, unspecified: Secondary | ICD-10-CM

## 2011-04-06 DIAGNOSIS — Z0000 Encounter for general adult medical examination without abnormal findings: Secondary | ICD-10-CM

## 2011-04-06 DIAGNOSIS — I471 Supraventricular tachycardia: Secondary | ICD-10-CM

## 2011-04-06 DIAGNOSIS — I498 Other specified cardiac arrhythmias: Secondary | ICD-10-CM

## 2011-04-06 DIAGNOSIS — E785 Hyperlipidemia, unspecified: Secondary | ICD-10-CM

## 2011-04-06 DIAGNOSIS — M67919 Unspecified disorder of synovium and tendon, unspecified shoulder: Secondary | ICD-10-CM

## 2011-04-06 DIAGNOSIS — K219 Gastro-esophageal reflux disease without esophagitis: Secondary | ICD-10-CM

## 2011-04-06 LAB — CBC WITH DIFFERENTIAL/PLATELET
Basophils Relative: 0.3 % (ref 0.0–3.0)
Eosinophils Absolute: 0.1 10*3/uL (ref 0.0–0.7)
HCT: 47.5 % (ref 39.0–52.0)
Lymphs Abs: 1.4 10*3/uL (ref 0.7–4.0)
MCHC: 33.4 g/dL (ref 30.0–36.0)
MCV: 90.8 fl (ref 78.0–100.0)
Monocytes Absolute: 0.4 10*3/uL (ref 0.1–1.0)
Neutro Abs: 5.7 10*3/uL (ref 1.4–7.7)
Neutrophils Relative %: 75 % (ref 43.0–77.0)
RBC: 5.23 Mil/uL (ref 4.22–5.81)

## 2011-04-06 LAB — URINALYSIS
Bilirubin Urine: NEGATIVE
Hgb urine dipstick: NEGATIVE
Leukocytes, UA: NEGATIVE
Nitrite: NEGATIVE
Total Protein, Urine: NEGATIVE
pH: 7.5 (ref 5.0–8.0)

## 2011-04-06 LAB — BASIC METABOLIC PANEL
Chloride: 101 mEq/L (ref 96–112)
Creatinine, Ser: 0.9 mg/dL (ref 0.4–1.5)
GFR: 105.1 mL/min (ref >60.00)
Potassium: 4 mEq/L (ref 3.5–5.1)

## 2011-04-06 LAB — HEPATIC FUNCTION PANEL
ALT: 19 U/L (ref 0–53)
Bilirubin, Direct: 0.2 mg/dL (ref 0.0–0.3)
Total Bilirubin: 1.4 mg/dL — ABNORMAL HIGH (ref 0.3–1.2)

## 2011-04-06 LAB — LIPID PANEL
LDL Cholesterol: 94 mg/dL (ref 0–99)
Total CHOL/HDL Ratio: 4

## 2011-04-06 MED ORDER — CLONAZEPAM 0.5 MG PO TABS: 0.5000 mg | ORAL_TABLET | Freq: Two times a day (BID) | ORAL | Status: DC | PRN

## 2011-04-06 MED ORDER — METOPROLOL TARTRATE 50 MG PO TABS: ORAL_TABLET | ORAL | Status: AC

## 2011-04-06 NOTE — Patient Instructions (Signed)
Today we updated your med list in EPIC...   We refilled your Metoprolol & we wrote a new prescription for KLONOPIN to take 1 tab up to twice daily as needed for the panic attacks...  Today we did your follow up CXR & fasting blood work...    Please call the PHONE TREE in a few days for your results...    Dial [id number] & when prompted enter your patient number followed by the # symbol...    Your patient number is:  [id number]  Call for any problems...  Let's plan another physical in one years time, but call for any problems in the interim.Marland KitchenMarland Kitchen

## 2011-04-06 NOTE — Progress Notes (Signed)
Subjective:    Patient ID: Trevor Mccullough, male    DOB: Nov 13, 1970, 40 y.o.   MRN: [id number]  HPI 40 y/o WM here for a follow up visit & CPX...   ~  seen 7/09 w/ bout of sinusitis w/ some persistant upper airway symptoms... he's had sevearal weeks of feeling poorly, sinus congestion/ draining/ stuffy w/ pressure in the head... assoc dizzy, fatigued, and coughing... he went to a Med 1st and diagnosed w/ sinusitis after an XRay was done- given Augmentin 875mg  Bid for 10d + Flonase... slight better but symptoms persist... we discuused Rx w/ Medrol to reduce the upper airway inflamm...  ~  April 01, 2010:  Baby daughter Trevor Mccullough born 02/28/09... he had f/u Cards in [place]- atyp CP, hx atrial tachy, occas palpit, doing satis on ToprolXL25...   ~  April 06, 2011:  Yearly ROV & CPX> Doing well overall but had tachypalpit eval by Cards in [place]> Atrial Tachy now on BBlocker... Also notes Panic Attacks "come out of nowhere" very infreq but scarey, we discussed Rx w/ KLONOPIN 0.5mg  prn up to Bid as directed...  SEE PROB LIST BELOW >>    He sees Cards in [place], DrKennedy> Atrial Tachy- controlled on meds (Metoprolol 50mg   1/2Bid); AtypCP, felt to be non cardiac, GXT 6/12 was neg w/ good exercise tol...          Problem List:  ALLERGIC RHINITIS (ICD-477.9) - uses Ocean spray and Flonase prn... Hx of SINUSITIS (ICD-473.9) - resolved w/ Rx in Jul09...  OBSTRUCTIVE SLEEP APNEA (ICD-327.23) - initially wife c/o his snoring... he has done this for years w/ college buddies also noting his snoring w/ apnea events... wife is a sound sleeper & hasn't noted apneas, restlessness, etc... he did note tiredness in the AM's & we did a sleep study 8/09 w/ AHI= 11 & RDI= 21 w/ desat to 86%... given trial CPAP 8 & saw DrClance 9/09 w/ referral to [facility] for oral appliance- he reports this caused some jaw problems therefore stopped using it & returned to CPAP but stopped this as well over time...  now he states he is resting well- not snoring, no daytime sleep pressure & doing OK. ~  9/12:  He reports a new special pillow that helps in addition; rests well, no daytime symptoms...  Hx of TACHY-PALPITATIONS >> he had thorough eval by DrKennedy in [facility] w/ event monitor showing atrial tachy and Stress Echo that was normal 8/09... Rx w/ METOPROLOL ER 50mg - 1/2 Bid and doing well> denies HA, fatigue, visual changes, CP, palipit, dizziness, syncope, dyspnea, edema, etc...   HYPERLIPIDEMIA, BORDERLINE (ICD-272.4) - on diet Rx + Fish Oil... ~  FLP 12/08 showed TChol 197, TG 124, HDL 39, LDL 133 ~  FLP 12/09 showed TChol 184, TG 69, HDL 38, LDL 132 ~  FLP 8/11 showed TChol 199, TG 155, HDL 39, LDL 129 ~  FLP 9/12 showed TChol 160, TG 153, HDL 36, LDL 94... Improved, rec low chol/ low fat diet + exercise.  GERD (ICD-530.81) - on PRILOSEC OTC 20mg /d and PEPCID at bedtime...  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 f/u Prn.  Hx of FRACTURE, CLAVICLE, RIGHT (ICD-810.00) w/ surgery... BURSITIS, RIGHT SHOULDER (ICD-726.10) - he is [facility]Call[facility] & had physical therapy...  ?PANIC ATTACKS >> new prob 9/12 w/ occas episodes c/w panic/ aniety syndrome (esp regarding  his cardiac palpit etc) & we discussed RX w/ KLONOPIN 0.5mg  Bid prn...  Health Maintenance:  he takes FISH OIL & MVI... he gets the Flu vaccine yearly & last Tetanus shot was ??   Past Surgical History  Procedure Date  . Surg for repair fx right clavicle 2007    Outpatient Encounter Prescriptions as of 04/06/2011  Medication Sig Dispense Refill  . famotidine (PEPCID) 20 MG tablet Take 20 mg by mouth daily.        . metoprolol (LOPRESSOR) 50 MG tablet Take 1/2 tablet by mouth two times daily       . Multiple Vitamins-Minerals (MENS MULTIVITAMIN PLUS) TABS Take 1 tablet by mouth daily.        .  Omega-3 Fatty Acids (FISH OIL TRIPLE STRENGTH) 1400 MG CAPS Take 1 capsule by mouth daily.        Marland Kitchen omeprazole (PRILOSEC) 20 MG capsule Take 20 mg by mouth daily.        . vitamin B-12 (CYANOCOBALAMIN) 1000 MCG tablet Take 1,000 mcg by mouth daily.          Allergies  Allergen Reactions  . Cephalexin     REACTION: rash/itching  . Codeine     REACTION: stomach upset    Current Medications, Allergies, Past Medical History, Past Surgical History, Family History, and Social History were reviewed in [employer] record.    Review of Systems         The patient complains of nasal congestion and palpitations.  The patient denies fever, chills, sweats, anorexia, fatigue, weakness, malaise, weight loss, sleep disorder, blurring, diplopia, eye irritation, eye discharge, vision loss, eye pain, photophobia, earache, ear discharge, tinnitus, decreased hearing, nosebleeds, sore throat, hoarseness, chest pain, syncope, dyspnea on exertion, orthopnea, PND, peripheral edema, cough, dyspnea at rest, excessive sputum, hemoptysis, wheezing, pleurisy, nausea, vomiting, diarrhea, constipation, change in bowel habits, abdominal pain, melena, hematochezia, jaundice, gas/bloating, indigestion/heartburn, dysphagia, odynophagia, dysuria, hematuria, urinary frequency, urinary hesitancy, nocturia, incontinence, back pain, joint pain, joint swelling, muscle cramps, muscle weakness, stiffness, arthritis, sciatica, restless legs, leg pain at night, leg pain with exertion, rash, itching, dryness, suspicious lesions, paralysis, paresthesias, seizures, tremors, vertigo, transient blindness, frequent falls, frequent headaches, difficulty walking, depression, anxiety, memory loss, confusion, cold intolerance, heat intolerance, polydipsia, polyphagia, polyuria, unusual weight change, abnormal bruising, bleeding, enlarged lymph nodes, urticaria, allergic rash, hay fever, and recurrent infections.    Objective:     Physical Exam      WD, WN, 40 y/o WM in NAD... GENERAL:  Alert & oriented; pleasant & cooperative... HEENT:  [place]/AT, EOM-wnl, PERRLA, Fundi-benign, EACs-clear, TMs-wnl, NOSE-clear, THROAT-clear & wnl. NECK:  Supple w/ full ROM; no JVD; normal carotid impulses w/o bruits; no thyromegaly or nodules palpated; no lymphadenopathy. Scar over right clacicle from prev fx & surg... CHEST:  Clear to P & A; without wheezes/ rales/ or rhonchi. HEART:  Regular Rhythm; without murmurs/ rubs/ or gallops. ABDOMEN:  Soft & nontender; normal bowel sounds; no organomegaly or masses detected. RECTAL:  Neg - prostate 2+ & nontender w/o nodules; stool hematest neg; +left spermatocele... EXT: without deformities or arthritic changes; no varicose veins/ venous insuffic/ or edema. NEURO:  CN's intact; motor testing normal; sensory testing normal; gait normal & balance OK. DERM:  No lesions noted; no rash etc...   Assessment & Plan:   CPX>  Hx Tachy-palpitations>  Followed by Cards in [place], their notes reviewed, pt stable on low dose BBlocker, continue same (he knows to avoid  caffeine, etc).  HYPERLIPID>  Improved on the Fish Oil & diet Rx...  GERD>  Controlled on Prilosec OTC + Pepcid Rx...  Spermatocele>  Without change on exam...  Ortho>  Stable, good exercise program...  Panic attack>  We discussed RX w/ KLONOPIN 0.5mg  up to Bid regularly if nec.Marland KitchenMarland Kitchen

## 2011-04-07 ENCOUNTER — Encounter: Payer: Self-pay | Admitting: Pulmonary Disease

## 2011-04-09 ENCOUNTER — Telehealth: Payer: Self-pay | Admitting: Pulmonary Disease

## 2011-04-09 NOTE — Telephone Encounter (Signed)
I spoke with pt and he states he received rx for metoprolol 50 mg 1 bid. Pt states he normally gets an rx for metoprolol XR 50 mg. Pt wants to make sure this is okay to take. Pt aware SN is out of the office and is fine with a call back next week. Pt states he still has a week left of the metoprolol XR. Please advise Dr. Kriste Basque. Thanks  Carver Fila, CMA

## 2011-04-12 NOTE — Telephone Encounter (Signed)
Called, spoke with pt.  He has been taking metoprolol succ ER 50mg  1/2 tablet bid but when he picked up new rx at pharm it is for metoprolol 50mg  1/2 tablet bid.  Pt states he has never taken 1 tablet bid.  He would just like to confirm whether he should take the metoprolol in place of the metoprolol er or should he have another rx called in.  Dr. Kriste Basque, pls advise.  Thanks!

## 2011-04-12 NOTE — Telephone Encounter (Signed)
According to previous records He is on  Metoprolol ER 50mg  1/2 Twice daily  #30, As needed  Refills.  That is fine to send rx to pharmacy , please confirm he is taking Twice daily  And whole tab daily

## 2011-04-12 NOTE — Telephone Encounter (Signed)
lmomtcb  

## 2011-04-13 NOTE — Telephone Encounter (Signed)
lmomtcb  

## 2011-04-13 NOTE — Telephone Encounter (Signed)
Per SN---recs to try the metoprolol tart(regular) 50mg   1/2 tab bid and see how he does.    Before he refills this med tell him to let us know his preference.   (either he can stay on the metoprolol tartrate 50mg   1/2 bid  Or we can change him to the metoprolol succinate er 50mg   1/2 bid.)  thanks

## 2011-04-13 NOTE — Telephone Encounter (Signed)
Pt aware of SN recs. Nothing further was needed 

## 2011-04-13 NOTE — Telephone Encounter (Signed)
Pt returned call .  Pt can be reached at [phone]. Trevor Mccullough

## 2012-04-04 ENCOUNTER — Encounter: Payer: Self-pay | Admitting: Pulmonary Disease

## 2012-04-04 ENCOUNTER — Telehealth: Payer: Self-pay | Admitting: Pulmonary Disease

## 2012-04-04 ENCOUNTER — Ambulatory Visit (INDEPENDENT_AMBULATORY_CARE_PROVIDER_SITE_OTHER): Payer: BC Managed Care – PPO | Admitting: Pulmonary Disease

## 2012-04-04 VITALS — BP 130/92 | HR 76 | Temp 97.4°F | Ht 75.0 in | Wt 181.8 lb

## 2012-04-04 DIAGNOSIS — I471 Supraventricular tachycardia: Secondary | ICD-10-CM

## 2012-04-04 DIAGNOSIS — I4719 Other supraventricular tachycardia: Secondary | ICD-10-CM

## 2012-04-04 DIAGNOSIS — R079 Chest pain, unspecified: Secondary | ICD-10-CM

## 2012-04-04 DIAGNOSIS — G4733 Obstructive sleep apnea (adult) (pediatric): Secondary | ICD-10-CM

## 2012-04-04 DIAGNOSIS — I498 Other specified cardiac arrhythmias: Secondary | ICD-10-CM

## 2012-04-04 DIAGNOSIS — K219 Gastro-esophageal reflux disease without esophagitis: Secondary | ICD-10-CM

## 2012-04-04 NOTE — Telephone Encounter (Signed)
Per SN---ok to add pt on this afternoon if that works for the pt.  thanks

## 2012-04-04 NOTE — Telephone Encounter (Signed)
Pt is scheduled to come in this afternoon at 3:30. Nothing further was needed

## 2012-04-04 NOTE — Progress Notes (Signed)
Subjective:    Patient ID: Trevor Mccullough, male    DOB: 1971-06-08, 41 y.o.   MRN: [id number]  HPI 41 y/o WM here for a follow up visit & CPX...   ~  seen 7/09 w/ bout of sinusitis w/ some persistant upper airway symptoms... he's had sevearal weeks of feeling poorly, sinus congestion/ draining/ stuffy w/ pressure in the head... assoc dizzy, fatigued, and coughing... he went to a Med 1st and diagnosed w/ sinusitis after an XRay was done- given Augmentin 875mg  Bid for 10d + Flonase... slight better but symptoms persist... we discuused Rx w/ Medrol to reduce the upper airway inflamm...  ~  April 01, 2010:  Baby daughter Trevor Mccullough born 02/28/09... he had f/u Cards in [place]- atyp CP, hx atrial tachy, occas palpit, doing satis on ToprolXL25...   ~  April 06, 2011:  Yearly ROV & CPX> Doing well overall but had tachypalpit eval by Cards in [place]> Atrial Tachy now on BBlocker... Also notes Panic Attacks "come out of nowhere" very infreq but scarey, we discussed Rx w/ KLONOPIN 0.5mg  prn up to Bid as directed...  SEE PROB LIST BELOW >>    He sees Cards in [place], Trevor Mccullough> Atrial Tachy- controlled on meds (Metoprolol 50mg -1/2Bid); AtypCP, felt to be non cardiac, GXT 6/12 was neg w/ good exercise tol...  ~  April 04, 2012:  52yr ROV & add-on for CP> Trevor Mccullough called & asked for an urgent add-on appt while in [place] due to burning SS CP going on for 2-3 days; initially it awoke him from sleep, not very severe at 1-2/10, intermittent & ?related to food, & position changes (worse supine), lasts 1-2H & relief noted w/ belching, notes sl nausea/ no vomiting/ no dysphagia or odynophagia;  He remains very active- mows etc & denies angina symptoms; he still sees Cardiology in [facility] once/yr for f/u tachypalpit hx- doing satis on Metop25Bid...  Note> Trevor Mccullough has hx GERD in past & was on Prilosec & Pepcid but he stopped on his own in 2012 in favor of Tums as needed;  He had prev GI eval in [place] in 2005 w/ Ba  swallow showing reflux by his report...  We discussed DEXILANT 60mg /d x10d samples then switch to PRILOSEC 20mg  OTC daily taken 30' befor the 1st meal of the day...    Otherw medically he is doing well> reports a good year overall;  We reviewed prob list, meds, xrays and labs> see below for updates >> EKG today showed NSR, rate74, rsr' in V1, otherw wnl, NAD...           Problem List:  ALLERGIC RHINITIS (ICD-477.9) - uses Ocean spray and Antihist prn... Hx of SINUSITIS (ICD-473.9) - resolved w/ Rx in Jul09...  OBSTRUCTIVE SLEEP APNEA (ICD-327.23) - initially wife c/o his snoring... he has done this for years w/ college buddies also noting his snoring w/ apnea events... wife is a sound sleeper & hasn't noted apneas, restlessness, etc... he did note tiredness in the AM's & we did a sleep study 8/09 w/ AHI= 11 & RDI= 21 w/ desat to 86%... given trial CPAP 8 & saw Trevor Mccullough 9/09 w/ referral to [facility] dental clinic for oral appliance- he reports this caused some jaw problems therefore stopped using it & returned to CPAP but stopped this as well over time... now he states he is resting well- not snoring, no daytime sleep pressure & doing OK. ~  9/12:  He reports a new special pillow that helps in addition; rests  well, no daytime symptoms... ~  CXR 9/12 showed normal heart size, clear lungs, screws overlying mid clavicle, NAD... ~  9/13:  He continues to rest well, no complaints from spouse, awakes refreshed w/ excellent daytime alertness & no issues reported...  Hx of TACHY-PALPITATIONS >> he had thorough eval by Trevor Mccullough in [facility] w/ event monitor showing atrial tachy and Stress Echo that was normal 8/09... Rx w/ METOPROLOL ER 50mg - 1/2 Bid and doing well> denies HA, fatigue, visual changes, CP, palipit, dizziness, syncope, dyspnea, edema, etc...  ~  6/12: GXT at [facility] in [place]> no CP or arrhythmia, SOB at peak stress, no ST changes, good exercise  tolerance (15 min thru stage5). ~  6/13: note from Trevor Mccullough> pt doing well w/ rare intermittent palpit thought due to stress & extra Metoprolol helped, EKG= NSR, WNL...  HYPERLIPIDEMIA, BORDERLINE (ICD-272.4) - on diet Rx + Fish Oil... ~  FLP 12/08 showed TChol 197, TG 124, HDL 39, LDL 133 ~  FLP 12/09 showed TChol 184, TG 69, HDL 38, LDL 132 ~  FLP 8/11 showed TChol 199, TG 155, HDL 39, LDL 129 ~  FLP 9/12 showed TChol 160, TG 153, HDL 36, LDL 94... Improved, rec low chol/ low fat diet + exercise.  GERD (ICD-530.81) - prev on Prilosec OTC 20mg /d and pepcid at bedtime... ~  9/13: He presented w/ 3d hx increased reflux symptoms and SSCP, burning, worse supine, & relief w/ belching; we discussed Rx w/ DEXILANT 60mg  x10d samples then return to [facility] 20mg /d taken 30' before the 1st meal of the day; if symptoms not resolved we will refer to GI for EGD, etc...  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 f/u Prn.  Hx of FRACTURE, CLAVICLE, RIGHT (ICD-810.00) w/ surgery... BURSITIS, RIGHT SHOULDER (ICD-726.10) - he is followed by Trevor Mccullough in [facility] for Ortho & had physical therapy...  ?PANIC ATTACKS >> new prob 9/12 w/ occas episodes c/w panic/ aniety syndrome (esp regarding his cardiac palpit etc) & we discussed RX w/ KLONOPIN 0.5mg  Bid prn...  Trevor Mccullough noted in f/u 9/13 that he never even had to take one pill "just having it nearby helps"  Health Maintenance:  he takes FISH OIL & MVI... he gets the Flu vaccine yearly & he had TDAP 2010...   Past Surgical History  Procedure Date  . Surg for repair fx right clavicle 2007    Outpatient Encounter Prescriptions as of 04/04/2012  Medication Sig Dispense Refill  . calcium carbonate (TUMS EX) 750 MG chewable tablet Chew 1 tablet by mouth at bedtime.      . metoprolol (LOPRESSOR) 50 MG tablet Take 1/2 tablet by mouth two times daily  90 tablet  3  . Multiple  Vitamins-Minerals (MENS MULTIVITAMIN PLUS) TABS Take 1 tablet by mouth daily.        . Omega-3 Fatty Acids (FISH OIL TRIPLE STRENGTH) 1400 MG CAPS Take 1 capsule by mouth daily.        Marland Kitchen omeprazole (PRILOSEC) 20 MG capsule Take 20 mg by mouth daily.        . vitamin B-12 (CYANOCOBALAMIN) 1000 MCG tablet Take 1,000 mcg by mouth daily.        . clonazePAM (KLONOPIN) 0.5 MG tablet Take 1 tablet (0.5 mg total) by mouth 2 (two) times daily as needed for anxiety.  60 tablet  0  . DISCONTD: famotidine (PEPCID) 20 MG tablet Take 20 mg by  mouth daily.          Allergies  Allergen Reactions  . Cephalexin     REACTION: rash/itching  . Codeine     REACTION: stomach upset    Current Medications, Allergies, Past Medical History, Past Surgical History, Family History, and Social History were reviewed in [employer] record.    Review of Systems         The patient complains of nasal congestion and palpitations.  The patient denies fever, chills, sweats, anorexia, fatigue, weakness, malaise, weight loss, sleep disorder, blurring, diplopia, eye irritation, eye discharge, vision loss, eye pain, photophobia, earache, ear discharge, tinnitus, decreased hearing, nosebleeds, sore throat, hoarseness, chest pain, syncope, dyspnea on exertion, orthopnea, PND, peripheral edema, cough, dyspnea at rest, excessive sputum, hemoptysis, wheezing, pleurisy, nausea, vomiting, diarrhea, constipation, change in bowel habits, abdominal pain, melena, hematochezia, jaundice, gas/bloating, indigestion/heartburn, dysphagia, odynophagia, dysuria, hematuria, urinary frequency, urinary hesitancy, nocturia, incontinence, back pain, joint pain, joint swelling, muscle cramps, muscle weakness, stiffness, arthritis, sciatica, restless legs, leg pain at night, leg pain with exertion, rash, itching, dryness, suspicious lesions, paralysis, paresthesias, seizures, tremors, vertigo, transient blindness, frequent falls,  frequent headaches, difficulty walking, depression, anxiety, memory loss, confusion, cold intolerance, heat intolerance, polydipsia, polyphagia, polyuria, unusual weight change, abnormal bruising, bleeding, enlarged lymph nodes, urticaria, allergic rash, hay fever, and recurrent infections.    Objective:   Physical Exam      WD, WN, 41 y/o WM in NAD... GENERAL:  Alert & oriented; pleasant & cooperative... HEENT:  [place]/AT, EOM-wnl, PERRLA, Fundi-benign, EACs-clear, TMs-wnl, NOSE-clear, THROAT-clear & wnl. NECK:  Supple w/ full ROM; no JVD; normal carotid impulses w/o bruits; no thyromegaly or nodules palpated; no lymphadenopathy. Scar over right clacicle from prev fx & surg... CHEST:  Clear to P & A; without wheezes/ rales/ or rhonchi. HEART:  Regular Rhythm; without murmurs/ rubs/ or gallops. ABDOMEN:  Soft & nontender; normal bowel sounds; no organomegaly or masses detected. RECTAL:  Neg - prostate 2+ & nontender w/o nodules; stool hematest neg; +left spermatocele... EXT: without deformities or arthritic changes; no varicose veins/ venous insuffic/ or edema. NEURO:  CN's intact; motor testing normal; sensory testing normal; gait normal & balance OK. DERM:  No lesions noted; no rash etc...  RADIOLOGY DATA:  Reviewed in the EPIC EMR & discussed w/ the patient...  LABORATORY DATA:  Reviewed in the EPIC EMR & discussed w/ the patient...   Assessment & Plan:    GERD>  Recent symptoms off his prev rx w/ Prilosec/ Pepcid; we discussed trial Dexilant 60mg  x10d samples then switch back to PRILOSEC 20mg /d & call if not 100% resolved for further eval by GI w/ EGD etc...  Hx Tachy-palpitations>  Followed by Cards in [place], their notes reviewed, pt stable on low dose BBlocker, continue same (he knows to avoid caffeine, etc).  HYPERLIPID>  Improved on the Fish Oil & diet Rx...  Spermatocele>  Without change on exam...  Ortho>  Stable, good exercise program...  Panic attack>  We discussed RX  w/ KLONOPIN 0.5mg  up to Bid regularly if nec, but he hasn't needed any...   Patient's Medications  New Prescriptions   No medications on file  Previous Medications   CALCIUM CARBONATE (TUMS EX) 750 MG CHEWABLE TABLET    Chew 1 tablet by mouth at bedtime.   CLONAZEPAM (KLONOPIN) 0.5 MG TABLET    Take 1 tablet (0.5 mg total) by mouth 2 (two) times daily as needed for anxiety.   METOPROLOL (LOPRESSOR) 50  MG TABLET    Take 1/2 tablet by mouth two times daily   MULTIPLE VITAMINS-MINERALS (MENS MULTIVITAMIN PLUS) TABS    Take 1 tablet by mouth daily.     OMEGA-3 FATTY ACIDS (FISH OIL TRIPLE STRENGTH) 1400 MG CAPS    Take 1 capsule by mouth daily.     OMEPRAZOLE (PRILOSEC) 20 MG CAPSULE    Take 20 mg by mouth daily.     VITAMIN B-12 (CYANOCOBALAMIN) 1000 MCG TABLET    Take 1,000 mcg by mouth daily.    Modified Medications   No medications on file  Discontinued Medications   FAMOTIDINE (PEPCID) 20 MG TABLET    Take 20 mg by mouth daily.

## 2012-04-04 NOTE — Patient Instructions (Addendum)
Today we updated your med list in our EPIC system...    Continue your current medications the same...  We decided to place you on DEXILANT to suppress stomach acid for 10d...    Then restart your OTC PRILOSEC 20mg  taken 30 min before the 1st meal of the day...    You may also use PEPCID 20-40mg  at bedtime if needed...  Call for any recurrent symptoms or if you develop any difficulty swallowing etc..Marland Kitchen

## 2012-04-04 NOTE — Telephone Encounter (Signed)
I spoke with pt and he c/o pain in center of his chest--it's a burning/sore sensation and lots of belching x couple days. He has not ate anything to cause this. He is wanting to be seen ASAP. He lives in [place] so would not have been able to make it to see PT in [place]. TP is only here this afternoon but then is not back until Friday. He wants to be seen before then. Please advise SN thanks

## 2012-05-18 ENCOUNTER — Encounter: Payer: Self-pay | Admitting: *Deleted

## 2012-05-18 ENCOUNTER — Telehealth: Payer: Self-pay | Admitting: Pulmonary Disease

## 2012-05-18 NOTE — Telephone Encounter (Signed)
LMTCB

## 2012-05-18 NOTE — Telephone Encounter (Signed)
Pt returned call.  Trevor Mccullough ° °

## 2012-05-19 ENCOUNTER — Ambulatory Visit (INDEPENDENT_AMBULATORY_CARE_PROVIDER_SITE_OTHER): Payer: BC Managed Care – PPO | Admitting: Pulmonary Disease

## 2012-05-19 ENCOUNTER — Encounter: Payer: Self-pay | Admitting: Internal Medicine

## 2012-05-19 ENCOUNTER — Other Ambulatory Visit (INDEPENDENT_AMBULATORY_CARE_PROVIDER_SITE_OTHER): Payer: BC Managed Care – PPO

## 2012-05-19 ENCOUNTER — Encounter: Payer: Self-pay | Admitting: Pulmonary Disease

## 2012-05-19 ENCOUNTER — Ambulatory Visit (INDEPENDENT_AMBULATORY_CARE_PROVIDER_SITE_OTHER)
Admission: RE | Admit: 2012-05-19 | Discharge: 2012-05-19 | Disposition: A | Discharge status: Home / Self Care | Payer: BC Managed Care – PPO | Source: Ambulatory Visit | Attending: Pulmonary Disease | Admitting: Pulmonary Disease

## 2012-05-19 VITALS — BP 128/90 | HR 68 | Temp 97.9°F | Ht 75.0 in | Wt 180.2 lb

## 2012-05-19 DIAGNOSIS — Z0000 Encounter for general adult medical examination without abnormal findings: Secondary | ICD-10-CM

## 2012-05-19 DIAGNOSIS — E785 Hyperlipidemia, unspecified: Secondary | ICD-10-CM

## 2012-05-19 DIAGNOSIS — R1013 Epigastric pain: Secondary | ICD-10-CM

## 2012-05-19 LAB — BASIC METABOLIC PANEL
Chloride: 103 mEq/L (ref 96–112)
GFR: 88.71 mL/min (ref >60.00)
Potassium: 4.3 mEq/L (ref 3.5–5.1)
Sodium: 141 mEq/L (ref 135–145)

## 2012-05-19 LAB — URINALYSIS
Bilirubin Urine: NEGATIVE
Hgb urine dipstick: NEGATIVE
Ketones, ur: NEGATIVE
Leukocytes, UA: NEGATIVE
pH: 7.5 (ref 5.0–8.0)

## 2012-05-19 LAB — CBC WITH DIFFERENTIAL/PLATELET
Basophils Absolute: 0 10*3/uL (ref 0.0–0.1)
Eosinophils Absolute: 0.1 10*3/uL (ref 0.0–0.7)
HCT: 46.3 % (ref 39.0–52.0)
Hemoglobin: 15.6 g/dL (ref 13.0–17.0)
Lymphs Abs: 1.5 10*3/uL (ref 0.7–4.0)
MCHC: 33.7 g/dL (ref 30.0–36.0)
MCV: 90.6 fl (ref 78.0–100.0)
Monocytes Absolute: 0.5 10*3/uL (ref 0.1–1.0)
Monocytes Relative: 5.6 % (ref 3.0–12.0)
Neutro Abs: 7.6 10*3/uL (ref 1.4–7.7)
Platelets: 146 10*3/uL — ABNORMAL LOW (ref 150.0–400.0)
RDW: 12.3 % (ref 11.5–14.6)

## 2012-05-19 LAB — LIPID PANEL
LDL Cholesterol: 98 mg/dL (ref 0–99)
VLDL: 15.6 mg/dL (ref 0.0–40.0)

## 2012-05-19 LAB — HEPATIC FUNCTION PANEL
ALT: 18 U/L (ref 0–53)
AST: 16 U/L (ref 0–37)
Alkaline Phosphatase: 50 U/L (ref 39–117)
Bilirubin, Direct: 0.2 mg/dL (ref 0.0–0.3)
Total Bilirubin: 1.4 mg/dL — ABNORMAL HIGH (ref 0.3–1.2)

## 2012-05-19 NOTE — Patient Instructions (Addendum)
Today we updated your med list in our EPIC system...    Continue your current medications the same...  Today we did your follow up FASTING blood work & CXR...    We will communicate the result to you when avail...  We will also arrange for a GI appt w/ DrGessner for further eval of your epigastic pain...    In the meanwhile, continue the Prilosec in the AM & Pepcid in the PM...  Call for any questions.Marland KitchenMarland Kitchen

## 2012-05-19 NOTE — Telephone Encounter (Signed)
Pt has cpx this morning at 10am - no answer on pt's home number at 9:24am.  No cell number on file.  As pt arrived for ov w/ SN, will sign off on message.

## 2012-05-19 NOTE — Progress Notes (Signed)
Subjective:    Patient ID: Trevor Mccullough, male    DOB: 12-17-1970, 41 y.o.   MRN: [id number]  HPI 41 y/o WM here for a follow up visit & CPX... 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  ~  seen 7/09 w/ bout of sinusitis w/ some persistant upper airway symptoms... he's had sevearal weeks of feeling poorly, sinus congestion/ draining/ stuffy w/ pressure in the head... assoc dizzy, fatigued, and coughing... he went to a Med 1st and diagnosed w/ sinusitis after an XRay was done- given Augmentin 875mg  Bid for 10d + Flonase... slight better but symptoms persist... we discuused Rx w/ Medrol to reduce the upper airway inflamm...  ~  April 01, 2010:  Baby daughter [name] born 02/28/09... he had f/u Cards in [place]- atyp CP, hx atrial tachy, occas palpit, doing satis on ToprolXL25...   ~  April 06, 2011:  Yearly ROV & CPX> Doing well overall but had tachypalpit eval by Cards in [place]> Atrial Tachy now on BBlocker... Also notes Panic Attacks "come out of nowhere" very infreq but scarey, we discussed Rx w/ KLONOPIN 0.5mg  prn up to Bid as directed...  SEE PROB LIST BELOW >>    He sees Cards in [place], DrKennedy> Atrial Tachy- controlled on meds (Metoprolol 50mg -1/2Bid); AtypCP, felt to be non cardiac, GXT 6/12 was neg w/ good exercise tol...  ~  April 04, 2012:  65yr ROV & add-on for CP> Trevor Mccullough called & asked for an urgent add-on appt while in [place] due to burning SS CP going on for 2-3 days; initially it awoke him from sleep, not very severe at 1-2/10, intermittent & ?related to food, & position changes (worse supine), lasts 1-2H & relief noted w/ belching, notes sl nausea/ no vomiting/ no dysphagia or odynophagia;  He remains very active- mows etc & denies angina symptoms; he still sees Cardiology in [facility] once/yr for f/u tachypalpit hx- doing satis on Metop25Bid...  Note> [name] has hx GERD in past & was on Prilosec & Pepcid but he stopped on his own in 2012 in favor of Tums as needed;  He had prev GI  eval in [place] in 2005 w/ Ba swallow showing reflux by his report...  We discussed DEXILANT 60mg /d x10d samples then switch to PRILOSEC 20mg  OTC daily taken 30' befor the 1st meal of the day...    Otherw medically he is doing well> reports a good year overall;  We reviewed prob list, meds, xrays and labs> see below for updates >> EKG today showed NSR, rate74, rsr' in V1, otherw wnl, NAD...  ~  May 19, 2012:  ROV w/ CPX> Trevor Mccullough reports that his prev SSCP & burning improved on the Dexilant but then recurred on the Prilosec; we discussed referral to GI for further eval & prob EGD...     OSA> prev on CPAP but none for several yrs, doing satis- resting well, wakes refreshed, no daytime hypersomnolence, etc...    Hx Palpit> on Metop50-1/2Bid; Hx AtypCP, palpit, w/o SOB/swelling; he remains active & knows to avoid caffeine etc...    Lipids> on FishOil; FLP showed TChol 148, TG 78, HDL 35, LDL 98    GERD> on Prilosec & Pepcid now; his SSCP & epig burning persists- we will refer to GI for further eval...    Ortho> hx right shoulder bursitis, prev fx right clavicle; uses OTC meds as needed...    ?Panic attacks?> offered rx w/ Klonopin but he never filled it; states no episodes & doing well... We reviewed  prob list, meds, xrays and labs> see below for updates >>  CXR 10/13 showed normal heart size, clear lungs, prev right clavicle surg... LABS 10/13:  FLP- ok x HDL=35;  Chems- wnl;  CBC- wnl;  TSH=1.56;  UA- clear          Problem List:  ALLERGIC RHINITIS (ICD-477.9) - uses Ocean spray and Antihist prn... Hx of SINUSITIS (ICD-473.9) - resolved w/ Rx in Jul09...  OBSTRUCTIVE SLEEP APNEA (ICD-327.23) - initially wife c/o his snoring... he has done this for years w/ college buddies also noting his snoring w/ apnea events... wife is a sound sleeper & hasn't noted apneas, restlessness, etc... he did note tiredness in the AM's & we did a sleep study 8/09 w/ AHI= 11 & RDI= 21 w/ desat to 86%... given trial  CPAP 8 & saw DrClance 9/09 w/ referral to [facility] for oral appliance- he reports this caused some jaw problems therefore stopped using it & returned to CPAP but stopped this as well over time... now he states he is resting well- not snoring, no daytime sleep pressure & doing OK. ~  9/12:  He reports a new special pillow that helps in addition; rests well, no daytime symptoms... ~  CXR 9/12 showed normal heart size, clear lungs, screws overlying mid clavicle, NAD... ~  9/13:  He continues to rest well, no complaints from spouse, awakes refreshed w/ excellent daytime alertness & no issues reported...  Hx of TACHY-PALPITATIONS >> he had thorough eval by DrKennedy in [facility] w/ event monitor showing atrial tachy and Stress Echo that was normal 8/09... Rx w/ METOPROLOL ER 50mg - 1/2 Bid and doing well> denies HA, fatigue, visual changes, CP, palipit, dizziness, syncope, dyspnea, edema, etc...  ~  6/12: GXT at [facility] in [place]> no CP or arrhythmia, SOB at peak stress, no ST changes, good exercise tolerance (15 min thru stage5). ~  6/13: note from DrKennedy> pt doing well w/ rare intermittent palpit thought due to stress & extra Metoprolol helped, EKG= NSR, WNL... ~  10/13: stable w/ rare palp episodes on the Metoprolol Rx...  HYPERLIPIDEMIA, BORDERLINE (ICD-272.4) - on diet Rx + Fish Oil... ~  FLP 12/08 showed TChol 197, TG 124, HDL 39, LDL 133 ~  FLP 12/09 showed TChol 184, TG 69, HDL 38, LDL 132 ~  FLP 8/11 showed TChol 199, TG 155, HDL 39, LDL 129 ~  FLP 9/12 showed TChol 160, TG 153, HDL 36, LDL 94... Improved, rec low chol/ low fat diet + exercise. ~  FLP 10/13 showed TChol 148, TG 78, HDL 35, LDL 98  GERD (ICD-530.81) - prev on Prilosec OTC 20mg /d and pepcid at bedtime... ~  9/13: He presented w/ 3d hx increased reflux symptoms and SSCP, burning, worse supine, & relief w/ belching; we discussed Rx w/ DEXILANT 60mg  x10d samples then return  to PRILOSEC 20mg /d taken 30' before the 1st meal of the day; if symptoms not resolved we will refer to GI for EGD, etc... ~  10/13: Symptoms improved w/ Dexilant but returned on Prilosec/ Pepcid; refer to GI for further eval...  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 f/u Prn.  Hx of FRACTURE, CLAVICLE, RIGHT (ICD-810.00) w/ surgery... BURSITIS, RIGHT SHOULDER (ICD-726.10) - he is followed by DrCalloway in [facility] & had physical therapy...  ?PANIC ATTACKS >> new prob 9/12 w/ occas episodes c/w panic/ aniety syndrome (esp regarding  his cardiac palpit etc) & we discussed RX w/ KLONOPIN 0.5mg  Bid prn...  Trevor Mccullough noted in f/u 9/13 that he never even had to take one pill "just having it nearby helps"  Health Maintenance:  he takes FISH OIL & MVI... he gets the Flu vaccine yearly & he had TDAP 2010...   Past Surgical History  Procedure Date  . Surg for repair fx right clavicle 2007    Outpatient Encounter Prescriptions as of 05/19/2012  Medication Sig Dispense Refill  . famotidine (PEPCID) 20 MG tablet Take 20 mg by mouth at bedtime as needed.      . metoprolol (LOPRESSOR) 50 MG tablet Take 1/2 tablet by mouth two times daily  90 tablet  3  . Multiple Vitamins-Minerals (MENS MULTIVITAMIN PLUS) TABS Take 1 tablet by mouth daily.        . Omega-3 Fatty Acids (FISH OIL TRIPLE STRENGTH) 1400 MG CAPS Take 1 capsule by mouth daily.        Marland Kitchen omeprazole (PRILOSEC) 20 MG capsule Take 20 mg by mouth daily.        . vitamin B-12 (CYANOCOBALAMIN) 1000 MCG tablet Take 1,000 mcg by mouth daily.        Marland Kitchen DISCONTD: calcium carbonate (TUMS EX) 750 MG chewable tablet Chew 1 tablet by mouth at bedtime.      Marland Kitchen DISCONTD: clonazePAM (KLONOPIN) 0.5 MG tablet Take 1 tablet (0.5 mg total) by mouth 2 (two) times daily as needed for anxiety.  60 tablet  0    Allergies  Allergen Reactions  . Cephalexin     REACTION: rash/itching    . Codeine     REACTION: stomach upset    Current Medications, Allergies, Past Medical History, Past Surgical History, Family History, and Social History were reviewed in [employer] record.    Review of Systems         The patient complains of nasal congestion and palpitations.  The patient denies fever, chills, sweats, anorexia, fatigue, weakness, malaise, weight loss, sleep disorder, blurring, diplopia, eye irritation, eye discharge, vision loss, eye pain, photophobia, earache, ear discharge, tinnitus, decreased hearing, nosebleeds, sore throat, hoarseness, chest pain, syncope, dyspnea on exertion, orthopnea, PND, peripheral edema, cough, dyspnea at rest, excessive sputum, hemoptysis, wheezing, pleurisy, nausea, vomiting, diarrhea, constipation, change in bowel habits, abdominal pain, melena, hematochezia, jaundice, gas/bloating, indigestion/heartburn, dysphagia, odynophagia, dysuria, hematuria, urinary frequency, urinary hesitancy, nocturia, incontinence, back pain, joint pain, joint swelling, muscle cramps, muscle weakness, stiffness, arthritis, sciatica, restless legs, leg pain at night, leg pain with exertion, rash, itching, dryness, suspicious lesions, paralysis, paresthesias, seizures, tremors, vertigo, transient blindness, frequent falls, frequent headaches, difficulty walking, depression, anxiety, memory loss, confusion, cold intolerance, heat intolerance, polydipsia, polyphagia, polyuria, unusual weight change, abnormal bruising, bleeding, enlarged lymph nodes, urticaria, allergic rash, hay fever, and recurrent infections.    Objective:   Physical Exam      WD, WN, 41 y/o WM in NAD... GENERAL:  Alert & oriented; pleasant & cooperative... HEENT:  [place]/AT, EOM-wnl, PERRLA, Fundi-benign, EACs-clear, TMs-wnl, NOSE-clear, THROAT-clear & wnl. NECK:  Supple w/ full ROM; no JVD; normal carotid impulses w/o bruits; no thyromegaly or nodules palpated; no  lymphadenopathy. Scar over right clacicle from prev fx & surg... CHEST:  Clear to P & A; without wheezes/ rales/ or rhonchi. HEART:  Regular Rhythm; without murmurs/ rubs/ or gallops. ABDOMEN:  Soft & nontender; normal bowel sounds; no organomegaly or masses detected. RECTAL:  Neg - prostate 2+ & nontender w/o nodules; stool hematest  neg; +left spermatocele... EXT: without deformities or arthritic changes; no varicose veins/ venous insuffic/ or edema. NEURO:  CN's intact; motor testing normal; sensory testing normal; gait normal & balance OK. DERM:  No lesions noted; no rash etc...  RADIOLOGY DATA:  Reviewed in the EPIC EMR & discussed w/ the patient...  LABORATORY DATA:  Reviewed in the EPIC EMR & discussed w/ the patient...   Assessment & Plan:    GERD>  Trial of Dexilant 60mg  x10d samples helped, then switched back to PRILOSEC 20mg /d & Pepcid 20mg Qhs; but symptoms have recurred & we will refer to GI for further eval...  Hx Tachy-palpitations>  Followed by Cards in [place], their notes reviewed, pt stable on low dose BBlocker, continue same (he knows to avoid caffeine, etc).  HYPERLIPID>  Improved on the Fish Oil & diet Rx...  Spermatocele>  Without change on exam...  Ortho>  Stable, good exercise program...  Panic attack>  No recurrent episodes & he never even filled the rx for Klonopin...   Patient's Medications  New Prescriptions   No medications on file  Previous Medications   FAMOTIDINE (PEPCID) 20 MG TABLET    Take 20 mg by mouth at bedtime as needed.   METOPROLOL (LOPRESSOR) 50 MG TABLET    Take 1/2 tablet by mouth two times daily   MULTIPLE VITAMINS-MINERALS (MENS MULTIVITAMIN PLUS) TABS    Take 1 tablet by mouth daily.     OMEGA-3 FATTY ACIDS (FISH OIL TRIPLE STRENGTH) 1400 MG CAPS    Take 1 capsule by mouth daily.     OMEPRAZOLE (PRILOSEC) 20 MG CAPSULE    Take 20 mg by mouth daily.     VITAMIN B-12 (CYANOCOBALAMIN) 1000 MCG TABLET    Take 1,000 mcg by mouth daily.     Modified Medications   No medications on file  Discontinued Medications   CALCIUM CARBONATE (TUMS EX) 750 MG CHEWABLE TABLET    Chew 1 tablet by mouth at bedtime.   CLONAZEPAM (KLONOPIN) 0.5 MG TABLET    Take 1 tablet (0.5 mg total) by mouth 2 (two) times daily as needed for anxiety.

## 2012-06-16 ENCOUNTER — Encounter: Payer: Self-pay | Admitting: Internal Medicine

## 2012-06-16 ENCOUNTER — Ambulatory Visit (INDEPENDENT_AMBULATORY_CARE_PROVIDER_SITE_OTHER): Payer: BC Managed Care – PPO | Admitting: Internal Medicine

## 2012-06-16 VITALS — BP 130/80 | HR 66 | Ht 74.5 in | Wt 180.4 lb

## 2012-06-16 DIAGNOSIS — K219 Gastro-esophageal reflux disease without esophagitis: Secondary | ICD-10-CM

## 2012-06-16 DIAGNOSIS — R079 Chest pain, unspecified: Secondary | ICD-10-CM

## 2012-06-16 NOTE — Patient Instructions (Signed)
You have been scheduled for an endoscopy with propofol. Please follow written instructions given to you at your visit today. If you use inhalers (even only as needed) or a CPAP machine, please bring them with you on the day of your procedure.  Thank you for choosing me and [facility].  Carl E. Gessner, M.D., FACG  

## 2012-06-16 NOTE — Progress Notes (Signed)
  Subjective:    Patient ID: Trevor Mccullough, male    DOB: October 20, 1970, 41 y.o.   MRN: [id number]  HPI Very nice middle aged man with history of GERD, diagnosed in mid-30's ([place]). Had ba studies and was started on Pepcid. This controled pyrosis and regurgitation. 2-3 years ago started using Prilosec due to increased symptoms. Also then Pepcid AC at night. Over past several months has had a chest soreness and can feel food pass through at times. Some sharper chest pain at times. Has had a negative cardiac stress test. Notices a fleeting central chest pressure when jogging att times. Dexilant samples seemed to relive all sxs x 10 days. Back on Prilosec and Pepcid has had some symptoms. No dysphagia or unintentional wgt loss. No caffeine or tobacco  GI ROS o/w negative  Medications, allergies, past medical history, past surgical history, family history and social history are reviewed and updated in the EMR.   Review of Systems All other ROS negative or as per HPI    Objective:   Physical Exam General:  Well-developed, well-nourished and in no acute distress Eyes:  anicteric. ENT:   Mouth and posterior pharynx free of lesions.  Neck:   supple w/o thyromegaly or mass.  Lungs: Clear to auscultation bilaterally. Chest wall is nontender Heart:  S1S2, no rubs, murmurs, gallops. Abdomen:  soft, non-tender, no hepatosplenomegaly, hernia, or mass and BS+.  Lymph:  no cervical or supraclavicular adenopathy. Extremities:   no edema Skin   no rash. Right clavicular scar Neuro:  A&O x 3.  Psych:  appropriate mood and  Affect.   Data Reviewed: PCP note , Lab Results  Component Value Date   TSH 1.56 05/19/2012   Lab Results  Component Value Date   WBC 9.7 05/19/2012   HGB 15.6 05/19/2012   HCT 46.3 05/19/2012   MCV 90.6 05/19/2012   PLT 146.0* 05/19/2012         Assessment & Plan:   1. Chest pain    2. GERD (gastroesophageal reflux disease)     1. Atypical sxs for GERD and  cardiac 2. Given chronicity of GERD and the sxs he has - EGD is scheduled. The risks and benefits as well as alternatives of endoscopic procedure(s) have been discussed and reviewed. All questions answered. The patient agrees to proceed. 3. Consider PPI change (? Dose increase) after EGD   I appreciate the opportunity to care for this patient.  UX:LKGMW,NUUVO M, MD

## 2012-07-20 ENCOUNTER — Encounter: Payer: BC Managed Care – PPO | Admitting: Internal Medicine

## 2012-07-27 ENCOUNTER — Telehealth: Payer: Self-pay | Admitting: Pulmonary Disease

## 2012-07-27 MED ORDER — PROMETHAZINE HCL 25 MG RE SUPP: 25.0000 mg | Freq: Four times a day (QID) | RECTAL | Status: DC | PRN

## 2012-07-27 NOTE — Telephone Encounter (Signed)
Spoke with pt He states having nausea and occ episodes of dizziness x 5 days On the first fay of symptoms, he vomited once He has had loose stools, but no watery diarrhea No abd pain, f/c/s Would like something called in to the [facility] in [place] [address] Last ov with SN 05/19/12 No appt pending Allergies  Allergen Reactions  . Cephalexin     REACTION: rash/itching  . Codeine     REACTION: stomach upset  . Shellfish Allergy

## 2012-07-27 NOTE — Telephone Encounter (Signed)
Per CY-okay to give Phenergan supp 25 mg #6 1 every 6 hours prn nausea.

## 2012-07-27 NOTE — Telephone Encounter (Signed)
Rx was sent to pharm  Spoke with pt and notified that this was done She verbalized understanding and states nothing further needed 

## 2012-08-08 ENCOUNTER — Encounter: Payer: Self-pay | Admitting: Internal Medicine

## 2012-08-08 ENCOUNTER — Ambulatory Visit (AMBULATORY_SURGERY_CENTER): Payer: BC Managed Care – PPO | Admitting: Internal Medicine

## 2012-08-08 VITALS — BP 118/76 | HR 55 | Temp 96.8°F | Resp 16 | Ht 74.5 in | Wt 180.0 lb

## 2012-08-08 DIAGNOSIS — K2990 Gastroduodenitis, unspecified, without bleeding: Secondary | ICD-10-CM

## 2012-08-08 DIAGNOSIS — K219 Gastro-esophageal reflux disease without esophagitis: Secondary | ICD-10-CM

## 2012-08-08 DIAGNOSIS — K2970 Gastritis, unspecified, without bleeding: Secondary | ICD-10-CM

## 2012-08-08 DIAGNOSIS — R079 Chest pain, unspecified: Secondary | ICD-10-CM

## 2012-08-08 MED ORDER — SODIUM CHLORIDE 0.9 % IV SOLN: 500.0000 mL | INTRAVENOUS | Status: DC

## 2012-08-08 NOTE — Progress Notes (Signed)
Patient did not experience any of the following events: a burn prior to discharge; a fall within the facility; wrong site/side/patient/procedure/implant event; or a hospital transfer or hospital admission upon discharge from the facility. (G8907) Patient did not have preoperative order for IV antibiotic SSI prophylaxis. (G8918)  

## 2012-08-08 NOTE — Patient Instructions (Addendum)
The esophagus looks fine but the stomach appears irritated and inflamed - called gastritis. There are many causes, almost none are serious problems. I will let you know biopsy results by phone or mail. Since you are ok - stay on current medications.  Thank you for choosing me and [facility].  Iva Boop, MD, FACG YOU HAD AN ENDOSCOPIC PROCEDURE TODAY AT THE [facility]: Refer to the procedure report that was given to you for any specific questions about what was found during the examination.  If the procedure report does not answer your questions, please call your gastroenterologist to clarify.  If you requested that your care partner not be given the details of your procedure findings, then the procedure report has been included in a sealed envelope for you to review at your convenience later.  YOU SHOULD EXPECT: Some feelings of bloating in the abdomen. Passage of more gas than usual.  Walking can help get rid of the air that was put into your GI tract during the procedure and reduce the bloating. If you had a lower endoscopy (such as a colonoscopy or flexible sigmoidoscopy) you may notice spotting of blood in your stool or on the toilet paper. If you underwent a bowel prep for your procedure, then you may not have a normal bowel movement for a few days.  DIET: Your first meal following the procedure should be a light meal and then it is ok to progress to your normal diet.  A half-sandwich or bowl of soup is an example of a good first meal.  Heavy or fried foods are harder to digest and may make you feel nauseous or bloated.  Likewise meals heavy in dairy and vegetables can cause extra gas to form and this can also increase the bloating.  Drink plenty of fluids but you should avoid alcoholic beverages for 24 hours.  ACTIVITY: Your care partner should take you home directly after the procedure.  You should plan to take it easy, moving slowly for the rest of the day.  You  can resume normal activity the day after the procedure however you should NOT DRIVE or use heavy machinery for 24 hours (because of the sedation medicines used during the test).    SYMPTOMS TO REPORT IMMEDIATELY: A gastroenterologist can be reached at any hour.  During normal business hours, 8:30 AM to 5:00 PM Monday through Friday, call [phone].  After hours and on weekends, please call the GI answering service at [phone] who will take a message and have the physician on call contact you.   Following lower endoscopy (colonoscopy or flexible sigmoidoscopy):  Excessive amounts of blood in the stool  Significant tenderness or worsening of abdominal pains  Swelling of the abdomen that is new, acute  Fever of 100F or higher  Following upper endoscopy (EGD)  Vomiting of blood or coffee ground material  New chest pain or pain under the shoulder blades  Painful or persistently difficult swallowing  New shortness of breath  Fever of 100F or higher  Black, tarry-looking stools  FOLLOW UP: If any biopsies were taken you will be contacted by phone or by letter within the next 1-3 weeks.  Call your gastroenterologist if you have not heard about the biopsies in 3 weeks.  Our staff will call the home number listed on your records the next business day following your procedure to check on you and address any questions or concerns that you may have at that time  regarding the information given to you following your procedure. This is a courtesy call and so if there is no answer at the home number and we have not heard from you through the emergency physician on call, we will assume that you have returned to your regular daily activities without incident.  SIGNATURES/CONFIDENTIALITY: You and/or your care partner have signed paperwork which will be entered into your electronic medical record.  These signatures attest to the fact that that the information above on your After Visit Summary has  been reviewed and is understood.  Full responsibility of the confidentiality of this discharge information lies with you and/or your care-partner.

## 2012-08-08 NOTE — Op Note (Signed)
[facility] [address] [place], [address]   ENDOSCOPY PROCEDURE REPORT  PATIENT: Trevor Mccullough, Trevor Mccullough  MR#: [id number] BIRTHDATE: 1971/04/15 , 41  yrs. old GENDER: Male ENDOSCOPIST: Iva Boop, MD, Clementeen Graham REFERRED BY:  Alroy Dust, M.D. PROCEDURE DATE:  08/08/2012 PROCEDURE:  EGD w/ biopsy ASA CLASS:     Class II INDICATIONS:  Chest pain.   Heartburn.   follow up of GERD. MEDICATIONS: Propofol (Diprivan) 280 mg IV, MAC sedation, administered by CRNA, These medications were titrated to patient response per physician's verbal order, and Robinul 0.2 mg IV TOPICAL ANESTHETIC: Cetacaine Spray  DESCRIPTION OF PROCEDURE: After the risks benefits and alternatives of the procedure were thoroughly explained, informed consent was obtained.  The LB [id number] endoscope was introduced through the mouth and advanced to the second portion of the duodenum. Without limitations.  The instrument was slowly withdrawn as the mucosa was fully examined.        STOMACH: Moderate gastritis (inflammation) was found in the entire examined stomach. There was irregular mucosa, erythema, granularity. Multiple biopsies taken with cold forceps.  Sample sent for histology.  The remainder of the upper endoscopy exam was otherwise normal. Retroflexed views revealed as above.     The scope was then withdrawn from the patient and the procedure completed.  COMPLICATIONS: There were no complications. ENDOSCOPIC IMPRESSION: 1.   Gastritis (inflammation) was found in the entire examined stomach; multiple biopsies 2.   The remainder of the upper endoscopy exam was otherwise normal  RECOMMENDATIONS: 1.  Continue current medications - he now feels well on omperazole 20mg  qd and famotidine 20 mg prn 2.  Await pathology results   eSigned:  Iva Boop, MD, Clementeen Graham 08/08/2012 2:22 PM [id number] Elayne Snare, MD and The Patient

## 2012-08-09 ENCOUNTER — Telehealth: Payer: Self-pay

## 2012-08-09 NOTE — Telephone Encounter (Signed)
  Follow up Call-  Call back number 08/08/2012  Post procedure Call Back phone  # [phone]  Permission to leave phone message Yes     Patient questions:  Do you have a fever, pain , or abdominal swelling? no Pain Score  0 *  Have you tolerated food without any problems? yes  Have you been able to return to your normal activities? yes  Do you have any questions about your discharge instructions: Diet   no Medications  no Follow up visit  no  Do you have questions or concerns about your Care? no  Actions: * If pain score is 4 or above: No action needed, pain <4.

## 2012-10-31 HISTORY — PX: VASECTOMY: SHX75

## 2013-02-07 ENCOUNTER — Telehealth: Payer: Self-pay | Admitting: Pulmonary Disease

## 2013-02-07 NOTE — Telephone Encounter (Signed)
rcv'd records from [facility] forward 3 pages to Dr. Kriste Basque

## 2013-07-31 ENCOUNTER — Encounter: Payer: Self-pay | Admitting: Pulmonary Disease

## 2013-08-15 ENCOUNTER — Ambulatory Visit: Payer: BC Managed Care – PPO | Admitting: Pulmonary Disease

## 2013-10-15 ENCOUNTER — Telehealth: Payer: Self-pay | Admitting: Pulmonary Disease

## 2013-10-15 NOTE — Telephone Encounter (Signed)
Pt is aware that he can take his medication prior to his appointment. Also made aware that as of April 1 SN will no longer be doing primary care. Nothing further is needed at this time.

## 2013-10-16 ENCOUNTER — Other Ambulatory Visit (INDEPENDENT_AMBULATORY_CARE_PROVIDER_SITE_OTHER): Payer: BC Managed Care – PPO

## 2013-10-16 ENCOUNTER — Encounter: Payer: Self-pay | Admitting: Pulmonary Disease

## 2013-10-16 ENCOUNTER — Ambulatory Visit (INDEPENDENT_AMBULATORY_CARE_PROVIDER_SITE_OTHER): Payer: BC Managed Care – PPO | Admitting: Pulmonary Disease

## 2013-10-16 VITALS — BP 142/80 | HR 65 | Temp 97.5°F | Ht 75.0 in | Wt 175.4 lb

## 2013-10-16 DIAGNOSIS — I498 Other specified cardiac arrhythmias: Secondary | ICD-10-CM

## 2013-10-16 DIAGNOSIS — Z0000 Encounter for general adult medical examination without abnormal findings: Secondary | ICD-10-CM

## 2013-10-16 DIAGNOSIS — I471 Supraventricular tachycardia: Secondary | ICD-10-CM

## 2013-10-16 DIAGNOSIS — I4719 Other supraventricular tachycardia: Secondary | ICD-10-CM

## 2013-10-16 DIAGNOSIS — K219 Gastro-esophageal reflux disease without esophagitis: Secondary | ICD-10-CM

## 2013-10-16 DIAGNOSIS — E785 Hyperlipidemia, unspecified: Secondary | ICD-10-CM

## 2013-10-16 LAB — CBC WITH DIFFERENTIAL/PLATELET
BASOS PCT: 0.3 % (ref 0.0–3.0)
Basophils Absolute: 0 10*3/uL (ref 0.0–0.1)
EOS PCT: 1.3 % (ref 0.0–5.0)
Eosinophils Absolute: 0.1 10*3/uL (ref 0.0–0.7)
HCT: 46.2 % (ref 39.0–52.0)
HEMOGLOBIN: 15.7 g/dL (ref 13.0–17.0)
LYMPHS PCT: 22.4 % (ref 12.0–46.0)
Lymphs Abs: 1.4 10*3/uL (ref 0.7–4.0)
MCHC: 34 g/dL (ref 30.0–36.0)
MCV: 90.4 fl (ref 78.0–100.0)
MONO ABS: 0.4 10*3/uL (ref 0.1–1.0)
Monocytes Relative: 6.5 % (ref 3.0–12.0)
Neutro Abs: 4.5 10*3/uL (ref 1.4–7.7)
Neutrophils Relative %: 69.5 % (ref 43.0–77.0)
Platelets: 148 10*3/uL — ABNORMAL LOW (ref 150.0–400.0)
RBC: 5.11 Mil/uL (ref 4.22–5.81)
RDW: 12.7 % (ref 11.5–14.6)
WBC: 6.4 10*3/uL (ref 4.5–10.5)

## 2013-10-16 LAB — HEPATIC FUNCTION PANEL
ALT: 19 U/L (ref 0–53)
AST: 17 U/L (ref 0–37)
Albumin: 4.7 g/dL (ref 3.5–5.2)
Alkaline Phosphatase: 50 U/L (ref 39–117)
BILIRUBIN TOTAL: 1.5 mg/dL — AB (ref 0.3–1.2)
Bilirubin, Direct: 0.1 mg/dL (ref 0.0–0.3)
Total Protein: 7.3 g/dL (ref 6.0–8.3)

## 2013-10-16 LAB — BASIC METABOLIC PANEL
BUN: 11 mg/dL (ref 6–23)
CO2: 30 mEq/L (ref 19–32)
Calcium: 9.4 mg/dL (ref 8.4–10.5)
Chloride: 99 mEq/L (ref 96–112)
Creatinine, Ser: 0.8 mg/dL (ref 0.4–1.5)
GFR: 105.29 mL/min (ref >60.00)
Glucose, Bld: 87 mg/dL (ref 70–99)
Potassium: 4 mEq/L (ref 3.5–5.1)
Sodium: 138 mEq/L (ref 135–145)

## 2013-10-16 LAB — LIPID PANEL
Cholesterol: 171 mg/dL (ref 0–200)
HDL: 40.9 mg/dL (ref >39.00)
LDL CALC: 110 mg/dL — AB (ref 0–99)
TRIGLYCERIDES: 102 mg/dL (ref 0.0–149.0)
Total CHOL/HDL Ratio: 4
VLDL: 20.4 mg/dL (ref 0.0–40.0)

## 2013-10-16 LAB — TSH: TSH: 1.33 u[IU]/mL (ref 0.35–5.50)

## 2013-10-16 NOTE — Progress Notes (Signed)
Subjective:    Patient ID: Trevor Mccullough, male    DOB: 01-25-1971, 43 y.o.   MRN: [id number]  HPI 43 y/o WM here for a follow up visit & CPX... 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  ~  seen 7/09 w/ bout of sinusitis w/ some persistant upper airway symptoms... he's had sevearal weeks of feeling poorly, sinus congestion/ draining/ stuffy w/ pressure in the head... assoc dizzy, fatigued, and coughing... he went to a Med 1st and diagnosed w/ sinusitis after an XRay was done- given Augmentin 875mg  Bid for 10d + Flonase... slight better but symptoms persist... we discuused Rx w/ Medrol to reduce the upper airway inflamm...  ~  April 01, 2010:  Baby daughter [name] born 02/28/09... he had f/u Cards in [place]- atyp CP, hx atrial tachy, occas palpit, doing satis on ToprolXL25...   ~  April 06, 2011:  Yearly ROV & CPX> Doing well overall but had tachypalpit eval by Cards in [place]> Atrial Tachy now on BBlocker... Also notes Panic Attacks "come out of nowhere" very infreq but scarey, we discussed Rx w/ KLONOPIN 0.5mg  prn up to Bid as directed...  SEE PROB LIST BELOW >>    He sees Cards in [place], DrKennedy> Atrial Tachy- controlled on meds (Metoprolol 50mg -1/2Bid); AtypCP, felt to be non cardiac, GXT 6/12 was neg w/ good exercise tol...  ~  April 04, 2012:  73yr ROV & add-on for CP> Trevor Mccullough called & asked for an urgent add-on appt while in [place] due to burning SS CP going on for 2-3 days; initially it awoke him from sleep, not very severe at 1-2/10, intermittent & ?related to food, & position changes (worse supine), lasts 1-2H & relief noted w/ belching, notes sl nausea/ no vomiting/ no dysphagia or odynophagia;  He remains very active- mows etc & denies angina symptoms; he still sees Cardiology in [facility] once/yr for f/u tachypalpit hx- doing satis on Metop25Bid...  Note> Trevor Mccullough has hx GERD in past & was on Prilosec & Pepcid but he stopped on his own in 2012 in favor of Tums as needed;  He had prev GI  eval in [place] in 2005 w/ Ba swallow showing reflux by his report...  We discussed DEXILANT 60mg /d x10d samples then switch to PRILOSEC 20mg  OTC daily taken 30' befor the 1st meal of the day...    Otherw medically he is doing well> reports a good year overall;  We reviewed prob list, meds, xrays and labs> see below for updates >>  EKG today showed NSR, rate74, rsr' in V1, otherw wnl, NAD...  ~  May 19, 2012:  ROV w/ CPX> Trevor Mccullough reports that his prev SSCP & burning improved on the Dexilant but then recurred on the Prilosec; we discussed referral to GI for further eval & prob EGD...     OSA> prev on CPAP but none for several yrs, doing satis- resting well, wakes refreshed, no daytime hypersomnolence, etc...    Hx Palpit> on Metop50-1/2Bid; Hx AtypCP, palpit, w/o SOB/swelling; he remains active & knows to avoid caffeine etc...    Lipids> on FishOil; FLP showed TChol 148, TG 78, HDL 35, LDL 98    GERD> on Prilosec & Pepcid now; his SSCP & epig burning persists- we will refer to GI for further eval...    Ortho> hx right shoulder bursitis, prev fx right clavicle; uses OTC meds as needed...    ?Panic attacks?> offered rx w/ Klonopin but he never filled it; states no episodes & doing well... We  reviewed prob list, meds, xrays and labs> see below for updates >>   CXR 10/13 showed normal heart size, clear lungs, prev right clavicle surg...  LABS 10/13:  FLP- ok x HDL=35;  Chems- wnl;  CBC- wnl;  TSH=1.56;  UA- clear   ~  October 16, 2013:  73mo ROV & CPX> Trevor Mccullough has been doing well medically & his daugh is now ~45y/o & into competitive irish dancing! We reviewed the following medical problems during today's office visit >>     OSA> prev on CPAP but none for several yrs, doing satis- resting well, wakes refreshed, no daytime hypersomnolence, etc...    Hx Palpit> doing well on Metop50-1/2Bid & followed by Cards in [facility]; Hx AtypCP, palpit, w/o SOB/swelling; he remains active & knows to avoid  caffeine etc...    Lipids> on FishOil & diet; FLP 3/15 showed TChol 171, TG 102, HDL 41, LDL 110    GERD> on Prilosec20AM & Pepcid20PM now; his SSCP & epig burning has improved; EGD 1/14 by DrGessner showed gastritis (neg HPylori)...    GU- he had vasectomy & spermatocele surg by Delaney Meigs, Urology...    Ortho> hx right shoulder bursitis, prev fx right clavicle; uses OTC meds as needed...    ?Panic attacks?> offered rx w/ Klonopin but he never filled it; states no episodes & doing well...    He uses Minoxidil foam for mpb... We reviewed prob list, meds, xrays and labs> see below for updates >>   LABS 3/15:  FLP- ok x LDL=110;  Chems- wnl;  CBC- wnl;  TSH=1.33...            Problem List:  ALLERGIC RHINITIS (ICD-477.9) - uses Ocean spray and Antihist prn... Hx of SINUSITIS (ICD-473.9) - resolved w/ Rx in Jul09...  OBSTRUCTIVE SLEEP APNEA (ICD-327.23) - initially wife c/o his snoring... he has done this for years w/ college buddies also noting his snoring w/ apnea events... wife is a sound sleeper & hasn't noted apneas, restlessness, etc... he did note tiredness in the AM's & we did a sleep study 8/09 w/ AHI= 11 & RDI= 21 w/ desat to 86%... given trial CPAP 8 & saw DrClance 9/09 w/ referral to [facility] for oral appliance- he reports this caused some jaw problems therefore stopped using it & returned to CPAP but stopped this as well over time... now he states he is resting well- not snoring, no daytime sleep pressure & doing OK. ~  9/12:  He reports a new special pillow that helps in addition; rests well, no daytime symptoms... ~  CXR 9/12 showed normal heart size, clear lungs, screws overlying mid clavicle, NAD... ~  9/13:  He continues to rest well, no complaints from spouse, awakes refreshed w/ excellent daytime alertness & no issues reported... ~  3/15: prev on CPAP but none for several yrs, doing satis- resting well, wakes refreshed, no daytime hypersomnolence, etc...   Hx of  TACHY-PALPITATIONS >> he had thorough eval by DrKennedy in [facility] w/ event monitor showing atrial tachy and Stress Echo that was normal 8/09... Rx w/ METOPROLOL ER 50mg - 1/2 Bid and doing well> denies HA, fatigue, visual changes, CP, palipit, dizziness, syncope, dyspnea, edema, etc...  ~  6/12: GXT at [facility] in [place]> no CP or arrhythmia, SOB at peak stress, no ST changes, good exercise tolerance (15 min thru stage5). ~  6/13: note from DrKennedy> pt doing well w/ rare intermittent palpit thought due to stress &  extra Metoprolol helped, EKG= NSR, WNL... ~  10/13: stable w/ rare palp episodes on the Metoprolol Rx... ~  Note from Cards, DrKennedy 12/13> Atrial Tachy fairly well controlled on meds, atypCP- felt to be noncardiac, Rec to continue same meds...  ~  EKG 12/14 by Cards showed SBrady, rate59, wnl, NAD... ~  3/15: doing well on Metop50-1/2Bid & followed by Cards in [facility]; Hx AtypCP, palpit, w/o SOB/swelling; he remains active & knows to avoid caffeine etc.  HYPERLIPIDEMIA, BORDERLINE (ICD-272.4) - on diet Rx + Fish Oil... ~  FLP 12/08 showed TChol 197, TG 124, HDL 39, LDL 133 ~  FLP 12/09 showed TChol 184, TG 69, HDL 38, LDL 132 ~  FLP 8/11 showed TChol 199, TG 155, HDL 39, LDL 129 ~  FLP 9/12 showed TChol 160, TG 153, HDL 36, LDL 94... Improved, rec low chol/ low fat diet + exercise. ~  FLP 10/13 showed TChol 148, TG 78, HDL 35, LDL 98 ~  FLP 3/15 on diet + FishOil showed TChol 171, TG 102, HDL 41, LDL 110   GERD (ICD-530.81) - prev on Prilosec OTC 20mg /d and pepcid at bedtime... ~  9/13: He presented w/ 3d hx increased reflux symptoms and SSCP, burning, worse supine, & relief w/ belching; we discussed Rx w/ DEXILANT 60mg  x10d samples then return to [facility] 20mg /d taken 30' before the 1st meal of the day; if symptoms not resolved we will refer to GI for EGD, etc... ~  10/13: Symptoms improved w/ Dexilant but returned on  Prilosec/ Pepcid; refer to GI for further eval... ~  11/13: he saw DrGessner for GI f/u> EGD repeated 1/14 showed gastritis (neg HPylori) & he is improved on Omep20 + Pepcid20...   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 f/u Prn. ~  he had vasectomy & spermatocele surg by Delaney Meigs, Urology...  Hx of FRACTURE, CLAVICLE, RIGHT (ICD-810.00) w/ surgery... BURSITIS, RIGHT SHOULDER (ICD-726.10) - he is followed by DrCalloway in [facility] & had physical therapy...  ?PANIC ATTACKS >> new prob 9/12 w/ occas episodes c/w panic/ aniety syndrome (esp regarding his cardiac palpit etc) & we discussed RX w/ KLONOPIN 0.5mg  Bid prn...  Trevor Mccullough noted in f/u 9/13 that he never even had to take one pill "just having it nearby helps"  Health Maintenance:  he takes FISH OIL & MVI... he gets the Flu vaccine yearly & he had TDAP 2010...   Past Surgical History  Procedure Laterality Date  . Surg for repair fx right clavicle  2007    metal plate  . Vasectomy  10/31/2012    Outpatient Encounter Prescriptions as of 10/16/2013  Medication Sig  . famotidine (PEPCID) 20 MG tablet Take 20 mg by mouth at bedtime as needed.  . metoprolol (LOPRESSOR) 50 MG tablet Take 1/2 tablet by mouth two times daily  . Multiple Vitamins-Minerals (MENS MULTIVITAMIN PLUS) TABS Take 1 tablet by mouth daily.    . Omega-3 Fatty Acids (FISH OIL TRIPLE STRENGTH) 1400 MG CAPS Take 1 capsule by mouth daily.    Marland Kitchen omeprazole (PRILOSEC) 20 MG capsule Take 20 mg by mouth daily.    . vitamin B-12 (CYANOCOBALAMIN) 1000 MCG tablet Take 1,000 mcg by mouth daily.    . GuaiFENesin (MUCINEX PO) Take by mouth as needed.  . [DISCONTINUED] promethazine (PHENERGAN) 25 MG suppository Place 1 suppository (25 mg total) rectally every 6 (six) hours as needed for nausea.  Allergies  Allergen Reactions  . Cephalexin     REACTION: rash/itching  . Codeine     REACTION:  stomach upset  . Shellfish Allergy     Swelling, itching, hives, bleeds from his gums, throat itchy    Current Medications, Allergies, Past Medical History, Past Surgical History, Family History, and Social History were reviewed in [employer] record.    Review of Systems         The patient complains of nasal congestion and palpitations.  The patient denies fever, chills, sweats, anorexia, fatigue, weakness, malaise, weight loss, sleep disorder, blurring, diplopia, eye irritation, eye discharge, vision loss, eye pain, photophobia, earache, ear discharge, tinnitus, decreased hearing, nosebleeds, sore throat, hoarseness, chest pain, syncope, dyspnea on exertion, orthopnea, PND, peripheral edema, cough, dyspnea at rest, excessive sputum, hemoptysis, wheezing, pleurisy, nausea, vomiting, diarrhea, constipation, change in bowel habits, abdominal pain, melena, hematochezia, jaundice, gas/bloating, indigestion/heartburn, dysphagia, odynophagia, dysuria, hematuria, urinary frequency, urinary hesitancy, nocturia, incontinence, back pain, joint pain, joint swelling, muscle cramps, muscle weakness, stiffness, arthritis, sciatica, restless legs, leg pain at night, leg pain with exertion, rash, itching, dryness, suspicious lesions, paralysis, paresthesias, seizures, tremors, vertigo, transient blindness, frequent falls, frequent headaches, difficulty walking, depression, anxiety, memory loss, confusion, cold intolerance, heat intolerance, polydipsia, polyphagia, polyuria, unusual weight change, abnormal bruising, bleeding, enlarged lymph nodes, urticaria, allergic rash, hay fever, and recurrent infections.    Objective:   Physical Exam      WD, WN, 43 y/o WM in NAD... GENERAL:  Alert & oriented; pleasant & cooperative... HEENT:  [place]/AT, EOM-wnl, PERRLA, Fundi-benign, EACs-clear, TMs-wnl, NOSE-clear, THROAT-clear & wnl. NECK:  Supple w/ full ROM; no JVD; normal carotid impulses w/o  bruits; no thyromegaly or nodules palpated; no lymphadenopathy. Scar over right clacicle from prev fx & surg... CHEST:  Clear to P & A; without wheezes/ rales/ or rhonchi. HEART:  Regular Rhythm; without murmurs/ rubs/ or gallops. ABDOMEN:  Soft & nontender; normal bowel sounds; no organomegaly or masses detected. RECTAL:  Neg - prostate 2+ & nontender w/o nodules; stool hematest neg; +left spermatocele... EXT: without deformities or arthritic changes; no varicose veins/ venous insuffic/ or edema. NEURO:  CN's intact; motor testing normal; sensory testing normal; gait normal & balance OK. DERM:  No lesions noted; no rash etc...  RADIOLOGY DATA:  Reviewed in the EPIC EMR & discussed w/ the patient...  LABORATORY DATA:  Reviewed in the EPIC EMR & discussed w/ the patient...   Assessment & Plan:    GERD>  switched back to PRILOSEC 20mg /d & Pepcid 20mg Qhs w/ good control of symptoms...  Hx Tachy-palpitations>  Followed by Cards in [place], their notes reviewed, pt stable on low dose BBlocker, continue same (he knows to avoid caffeine, etc).  HYPERLIPID>  Improved on the Fish Oil & diet Rx...  Spermatocele>  Without change on exam...  Ortho>  Stable, good exercise program...  Panic attack>  No recurrent episodes & he never even filled the rx for Klonopin...   Patient's Medications  New Prescriptions   No medications on file  Previous Medications   FAMOTIDINE (PEPCID) 20 MG TABLET    Take 20 mg by mouth at bedtime as needed.   GUAIFENESIN (MUCINEX PO)    Take by mouth as needed.   METOPROLOL (LOPRESSOR) 50 MG TABLET    Take 1/2 tablet by mouth two times daily   MULTIPLE VITAMINS-MINERALS (MENS MULTIVITAMIN PLUS) TABS    Take 1 tablet by mouth daily.     OMEGA-3 FATTY  ACIDS (FISH OIL TRIPLE STRENGTH) 1400 MG CAPS    Take 1 capsule by mouth daily.     OMEPRAZOLE (PRILOSEC) 20 MG CAPSULE    Take 20 mg by mouth daily.     VITAMIN B-12 (CYANOCOBALAMIN) 1000 MCG TABLET    Take 1,000 mcg  by mouth daily.    Modified Medications   No medications on file  Discontinued Medications   PROMETHAZINE (PHENERGAN) 25 MG SUPPOSITORY    Place 1 suppository (25 mg total) rectally every 6 (six) hours as needed for nausea.

## 2013-10-16 NOTE — Patient Instructions (Signed)
Today we updated your med list in our EPIC system...    Continue your current medications the same...  Today we did your follow up FASTING blood work...    We will contact you w/ the results when available...   Call for any questions or if we can be of service in any way... 

## 2014-06-04 ENCOUNTER — Telehealth: Payer: Self-pay | Admitting: Pulmonary Disease

## 2014-06-04 NOTE — Telephone Encounter (Signed)
Called pt. He reports he works for [employer] and needs his last lab results for lipid panel. Nothing further needed

## 2014-08-25 IMAGING — CR DG CHEST 2V
2 series · 2 of 2 positions shown · non-contrast
Comparison: 04/06/2011.

CLINICAL DATA: Physical exam.  No complaints.  Nonsmoker.

CHEST - 2 VIEW

[view not recorded (1 of 2)]
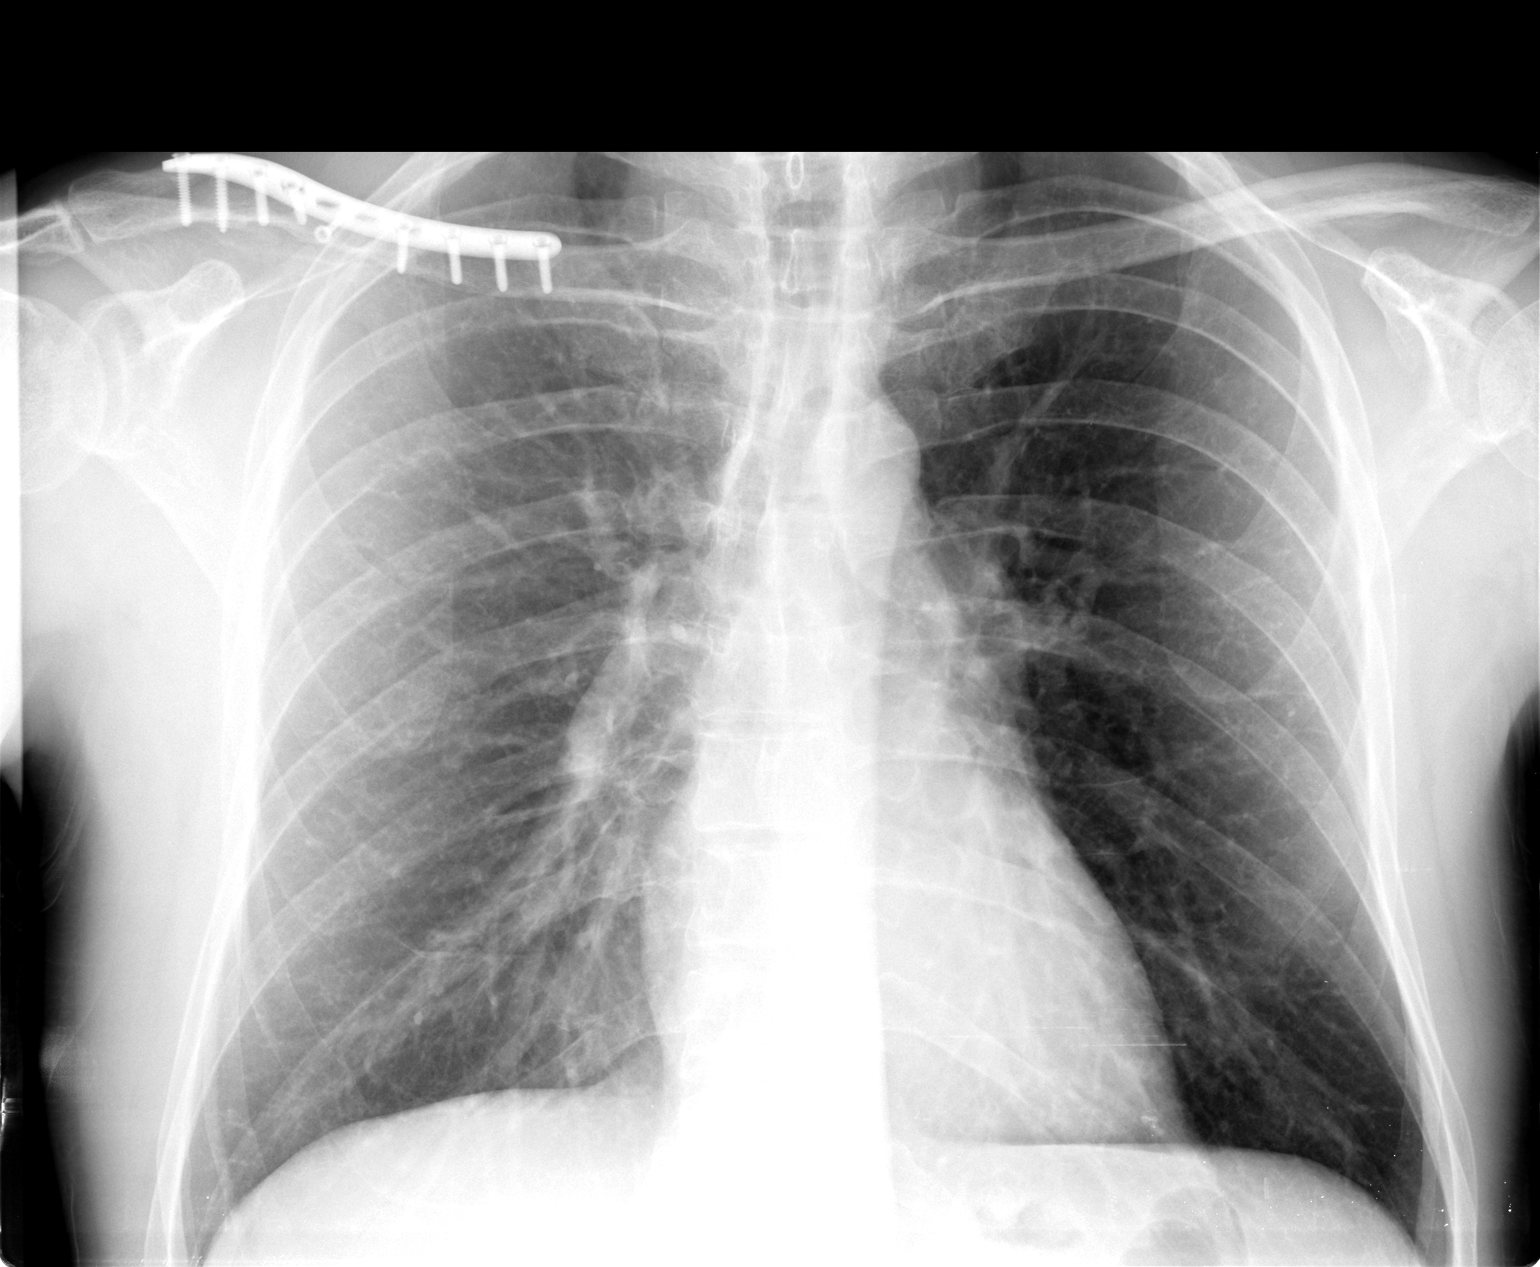

[view not recorded (2 of 2)]
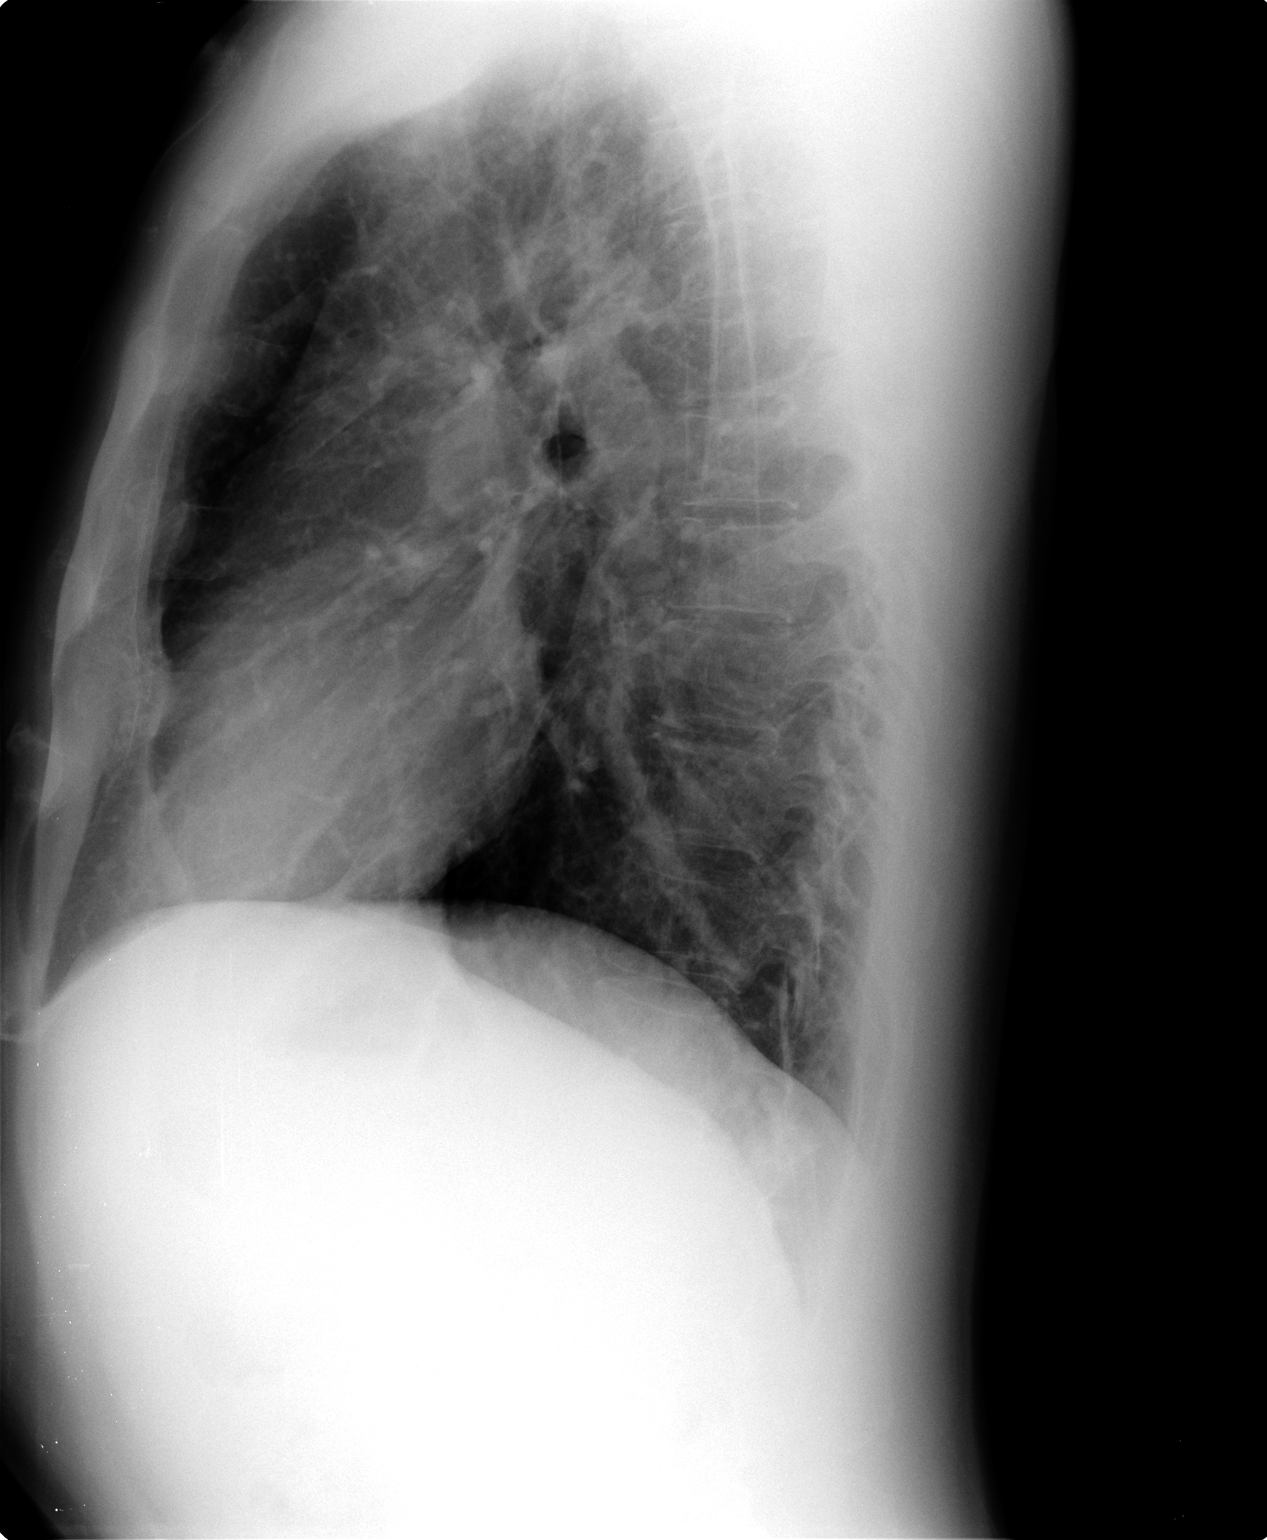

[2 of 2 positions shown; findings below may reference images not displayed]

FINDINGS: The heart size and mediastinal contours are within
normal limits.  Both lungs are clear.  The visualized skeletal
structures are unremarkable except for previous surgical repair of
a right clavicle fracture.
IMPRESSION: No active cardiopulmonary disease.

No change from prior normal chest 04/06/2011.
